# Patient Record
Sex: Female | Born: 1940 | Race: White | Hispanic: No | Marital: Single | Smoking: Current every day smoker
Health system: Southern US, Community
[De-identification: ages and names within clinical notes are randomized; demographics above are authoritative.]

## PROBLEM LIST (undated history)

## (undated) DIAGNOSIS — F329 Major depressive disorder, single episode, unspecified: Secondary | ICD-10-CM

## (undated) DIAGNOSIS — F419 Anxiety disorder, unspecified: Secondary | ICD-10-CM

## (undated) DIAGNOSIS — K219 Gastro-esophageal reflux disease without esophagitis: Secondary | ICD-10-CM

## (undated) DIAGNOSIS — Z9889 Other specified postprocedural states: Secondary | ICD-10-CM

## (undated) DIAGNOSIS — R112 Nausea with vomiting, unspecified: Secondary | ICD-10-CM

## (undated) DIAGNOSIS — F32A Depression, unspecified: Secondary | ICD-10-CM

## (undated) DIAGNOSIS — Z9289 Personal history of other medical treatment: Secondary | ICD-10-CM

## (undated) DIAGNOSIS — I10 Essential (primary) hypertension: Secondary | ICD-10-CM

## (undated) HISTORY — PX: TUBAL LIGATION: SHX77

## (undated) HISTORY — PX: ABDOMINAL HYSTERECTOMY: SHX81

---
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
## 1966-08-04 DIAGNOSIS — Z9289 Personal history of other medical treatment: Secondary | ICD-10-CM

## 1966-08-04 HISTORY — DX: Personal history of other medical treatment: Z92.89

## 2012-06-03 ENCOUNTER — Other Ambulatory Visit: Payer: Self-pay | Admitting: Orthopedic Surgery

## 2012-06-03 DIAGNOSIS — M5410 Radiculopathy, site unspecified: Secondary | ICD-10-CM

## 2012-06-03 DIAGNOSIS — M549 Dorsalgia, unspecified: Secondary | ICD-10-CM

## 2012-06-03 DIAGNOSIS — M4800 Spinal stenosis, site unspecified: Secondary | ICD-10-CM

## 2012-06-09 ENCOUNTER — Other Ambulatory Visit: Payer: Self-pay | Admitting: Neurosurgery

## 2012-06-09 ENCOUNTER — Ambulatory Visit
Admission: RE | Admit: 2012-06-09 | Discharge: 2012-06-09 | Disposition: A | Discharge status: Home / Self Care | Payer: No Typology Code available for payment source | Source: Ambulatory Visit | Attending: Orthopedic Surgery | Admitting: Orthopedic Surgery

## 2012-06-09 ENCOUNTER — Ambulatory Visit
Admission: RE | Admit: 2012-06-09 | Discharge: 2012-06-09 | Disposition: A | Discharge status: Home / Self Care | Payer: Medicare Other | Source: Ambulatory Visit | Attending: Orthopedic Surgery | Admitting: Orthopedic Surgery

## 2012-06-09 VITALS — BP 133/57 | HR 46 | Ht 61.0 in | Wt 120.0 lb

## 2012-06-09 DIAGNOSIS — M4800 Spinal stenosis, site unspecified: Secondary | ICD-10-CM

## 2012-06-09 DIAGNOSIS — M549 Dorsalgia, unspecified: Secondary | ICD-10-CM

## 2012-06-09 DIAGNOSIS — M5410 Radiculopathy, site unspecified: Secondary | ICD-10-CM

## 2012-06-09 MED ORDER — MEPERIDINE HCL 100 MG/ML IJ SOLN
75.0000 mg | Freq: Once | INTRAMUSCULAR | Status: AC
  Administered 2012-06-09: 75 mg via INTRAMUSCULAR

## 2012-06-09 MED ORDER — IOHEXOL 180 MG/ML  SOLN
15.0000 mL | Freq: Once | INTRAMUSCULAR | Status: AC | PRN
  Administered 2012-06-09: 15 mL via INTRATHECAL

## 2012-06-09 MED ORDER — HYDROXYZINE HCL 50 MG/ML IM SOLN
25.0000 mg | Freq: Once | INTRAMUSCULAR | Status: AC
  Administered 2012-06-09: 25 mg via INTRAMUSCULAR

## 2012-06-09 MED ORDER — DIAZEPAM 5 MG PO TABS
5.0000 mg | ORAL_TABLET | Freq: Once | ORAL | Status: AC
Start: 2012-06-09 — End: 2012-06-09
  Administered 2012-06-09: 5 mg via ORAL

## 2012-06-09 NOTE — Progress Notes (Signed)
Patient states she has been off Prozac for at least the past two days.  jkl 

## 2012-08-12 ENCOUNTER — Other Ambulatory Visit: Payer: Self-pay | Admitting: Neurosurgery

## 2012-10-11 ENCOUNTER — Encounter (HOSPITAL_COMMUNITY)
Admission: RE | Admit: 2012-10-11 | Discharge: 2012-10-11 | Disposition: A | Discharge status: Home / Self Care | Payer: Medicare Other | Source: Ambulatory Visit | Attending: Anesthesiology | Admitting: Anesthesiology

## 2012-10-11 ENCOUNTER — Encounter (HOSPITAL_COMMUNITY): Payer: Self-pay

## 2012-10-11 ENCOUNTER — Encounter (HOSPITAL_COMMUNITY)
Admission: RE | Admit: 2012-10-11 | Discharge: 2012-10-11 | Disposition: A | Discharge status: Home / Self Care | Payer: Medicare Other | Source: Ambulatory Visit | Attending: Neurosurgery | Admitting: Neurosurgery

## 2012-10-11 DIAGNOSIS — Z01818 Encounter for other preprocedural examination: Secondary | ICD-10-CM | POA: Insufficient documentation

## 2012-10-11 DIAGNOSIS — M5126 Other intervertebral disc displacement, lumbar region: Secondary | ICD-10-CM | POA: Insufficient documentation

## 2012-10-11 DIAGNOSIS — Z01812 Encounter for preprocedural laboratory examination: Secondary | ICD-10-CM | POA: Insufficient documentation

## 2012-10-11 DIAGNOSIS — Z01810 Encounter for preprocedural cardiovascular examination: Secondary | ICD-10-CM | POA: Insufficient documentation

## 2012-10-11 DIAGNOSIS — I10 Essential (primary) hypertension: Secondary | ICD-10-CM | POA: Insufficient documentation

## 2012-10-11 HISTORY — DX: Other specified postprocedural states: Z98.890

## 2012-10-11 HISTORY — DX: Depression, unspecified: F32.A

## 2012-10-11 HISTORY — DX: Nausea with vomiting, unspecified: R11.2

## 2012-10-11 HISTORY — DX: Major depressive disorder, single episode, unspecified: F32.9

## 2012-10-11 HISTORY — DX: Essential (primary) hypertension: I10

## 2012-10-11 HISTORY — DX: Gastro-esophageal reflux disease without esophagitis: K21.9

## 2012-10-11 LAB — COMPREHENSIVE METABOLIC PANEL
ALT: 27 U/L (ref 0–35)
Calcium: 9.6 mg/dL (ref 8.4–10.5)
Creatinine, Ser: 0.58 mg/dL (ref 0.50–1.10)
GFR calc Af Amer: >90 mL/min (ref >90)
Glucose, Bld: 101 mg/dL — ABNORMAL HIGH (ref 70–99)
Sodium: 139 mEq/L (ref 135–145)
Total Protein: 7.1 g/dL (ref 6.0–8.3)

## 2012-10-11 LAB — CBC
Hemoglobin: 14.3 g/dL (ref 12.0–15.0)
MCH: 32.1 pg (ref 26.0–34.0)
MCHC: 35.6 g/dL (ref 30.0–36.0)

## 2012-10-11 LAB — TYPE AND SCREEN
ABO/RH(D): O POS
Antibody Screen: NEGATIVE

## 2012-10-11 LAB — ABO/RH: ABO/RH(D): O POS

## 2012-10-11 LAB — SURGICAL PCR SCREEN: MRSA, PCR: NEGATIVE

## 2012-10-11 NOTE — Progress Notes (Addendum)
Monday...went to [facility] ER in 2003 for rapid heartrate.  Was given a shot (unsure of what it was).  It helped and patient has had no follow up  nor does she has the same problem.Marland Kitchen   [facility]Marland Kitchen DA   PCP has moved out of [place] and is now in [place].Marland KitchenMarland Kitchen#[phone]........fax [phone].  I have asked for anything cardiac, lov, ekg...Marland KitchenDA

## 2012-10-11 NOTE — Pre-Procedure Instructions (Signed)
Anber Mckiver  2018-09-712   Your procedure is scheduled on:  Wednesday, March 12th   Report to [facility] at 6:30 AM.  Call this number if you have problems the morning of surgery: [phone]   Remember:   Do not eat food or drink liquids after midnight Tuesday.   Take these medicines the morning of surgery with A SIP OF WATER:  None   Do not wear jewelry, make-up or nail polish.  Do not wear lotions, powders, or perfumes. You may NOT wear deodorant.  Do not shave underarms & legs 48 hours prior to surgery.    Do not bring valuables to the hospital.  Contacts, dentures or bridgework may not be worn into surgery.   Leave suitcase in the car. After surgery it may be brought to your room.  For patients admitted to the hospital, checkout time is 11:00 AM the day of discharge.   Name and phone number of your driver:    Special Instructions: Shower using CHG 2 nights before surgery and the night before surgery.  If you shower the day of surgery use CHG.  Use special wash - you have one bottle of CHG for all showers.  You should use approximately 1/3 of the bottle for each shower.   Please read over the following fact sheets that you were given: Pain Booklet, Coughing and Deep Breathing, Blood Transfusion Information, MRSA Information and Surgical Site Infection Prevention

## 2012-10-13 ENCOUNTER — Inpatient Hospital Stay (HOSPITAL_COMMUNITY): Admission: RE | Admit: 2012-10-13 | Payer: Medicare Other | Source: Ambulatory Visit | Admitting: Neurosurgery

## 2012-10-13 ENCOUNTER — Encounter (HOSPITAL_COMMUNITY): Admission: RE | Payer: Self-pay | Source: Ambulatory Visit

## 2012-10-13 SURGERY — POSTERIOR LUMBAR FUSION 3 LEVEL
Anesthesia: General

## 2013-02-10 IMAGING — CT CT L SPINE W/ CM
3 of 10 series · 9 of 27 positions shown, 10 images · IV contrast (omnipaque)
Comparison: MRI lumbar spine 04/21/2012 at [REDACTED].

CLINICAL DATA: Low back pain extending into the right hip and
lower extremity.  Right lower extremity contractures.

MYELOGRAM INJECTION
TECHNIQUE: Informed consent was obtained from the patient prior to
the procedure, including potential complications of headache,
allergy, infection and pain.  A timeout procedure was performed.
With the patient prone, the lower back was prepped with Betadine.
1% Lidocaine was used for local anesthesia.  Lumbar puncture was
performed at the left paramidline L1-2 level using a 22 gauge
needle with return of clear CSF.  15 ml of Omnipaque 947was
injected into the subarachnoid space .
TECHNIQUE: I personally performed the lumbar puncture and
administered the intrathecal contrast. I also personally supervised
acquisition of the myelogram images. Following injection of
intrathecal Omnipaque contrast, spine imaging in multiple
projections was performed using fluoroscopy.
Fluoroscopy Time: 53 seconds.
TECHNIQUE: CT imaging of the lumbar spine was performed after
intrathecal contrast administration.  Multiplanar CT image
reconstructions were also generated.

[Series 2: l spine bone · axial · 0.27mm/px · z∈[-4,+71]mm · 2 of 90 slices shown, 3 images]
[im 30/90  soft-tissue]
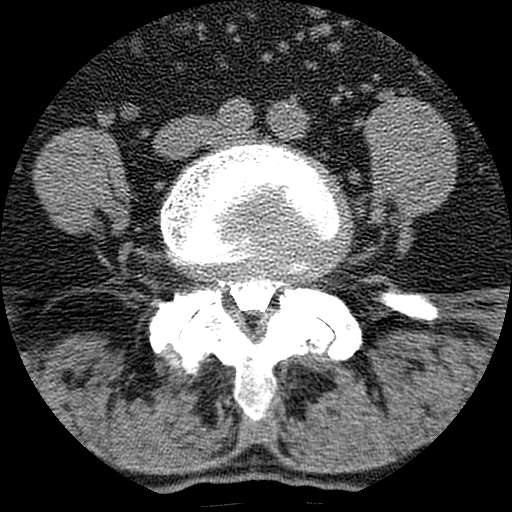
[im 30/90  bone]
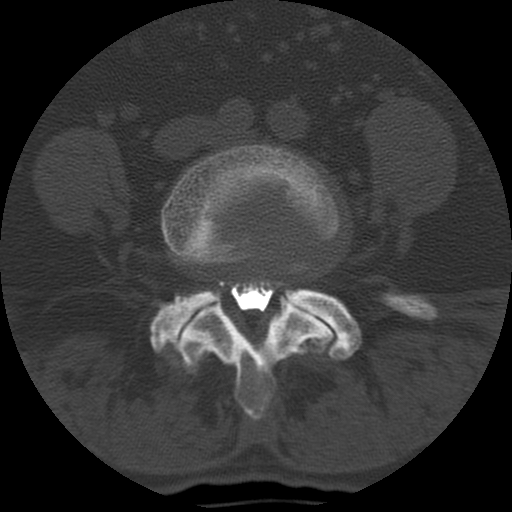
[im 60/90  bone]
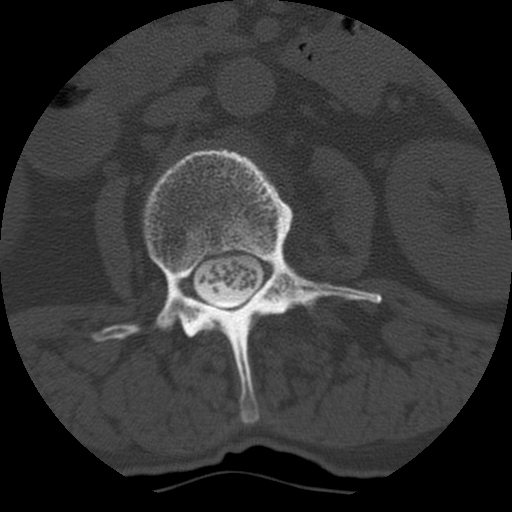

[Series 3: l spine soft · axial · 0.27mm/px · z∈[-4,+71]mm · 2 of 90 slices shown]
[im 30/90  soft-tissue]
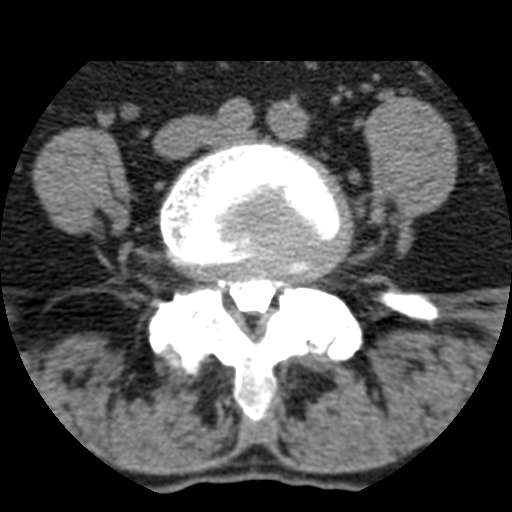
[im 60/90  soft-tissue]
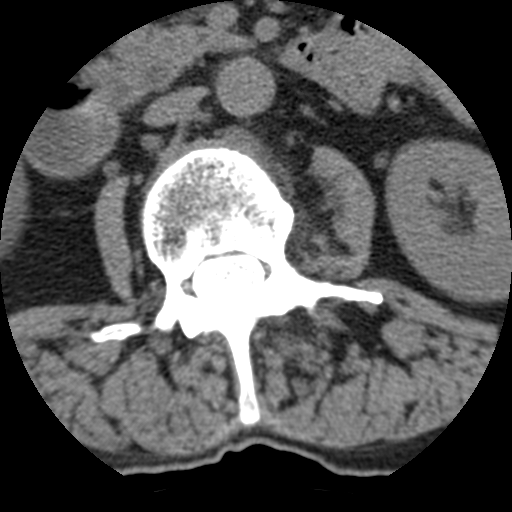

[Series 400: coronal · coronal · 0.44mm/px · 5 of 50 slices shown]
[im 9/50  bone]
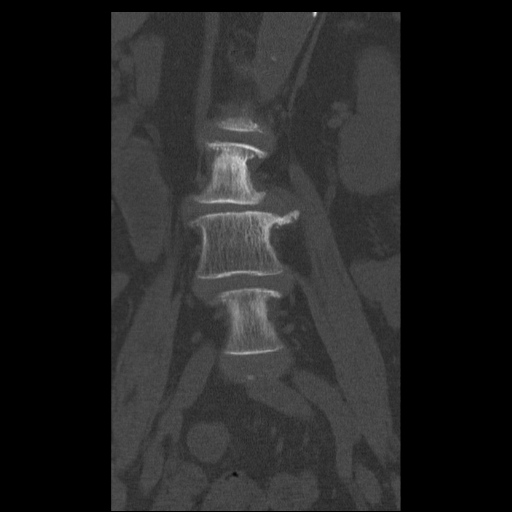
[im 17/50  bone]
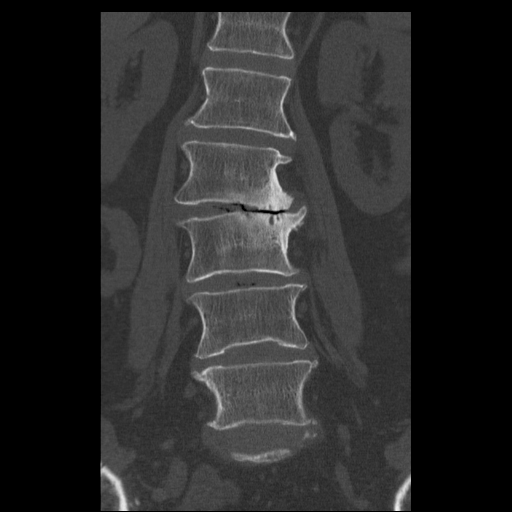
[im 25/50  bone]
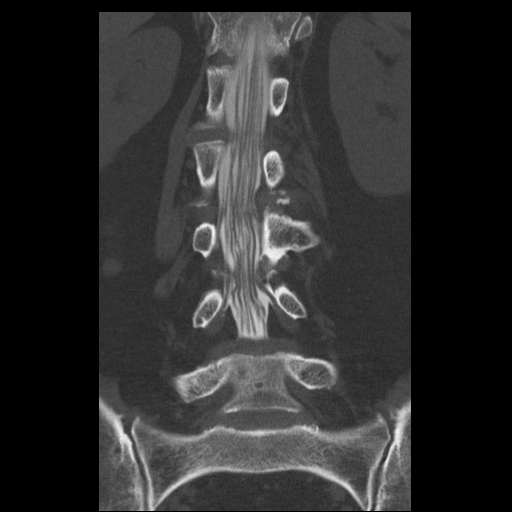
[im 33/50  bone]
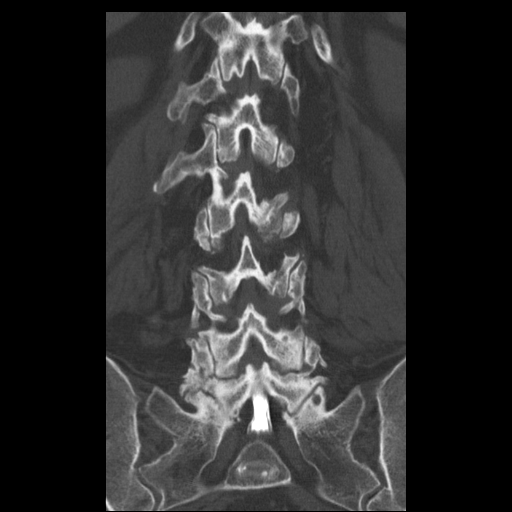
[im 41/50  bone]
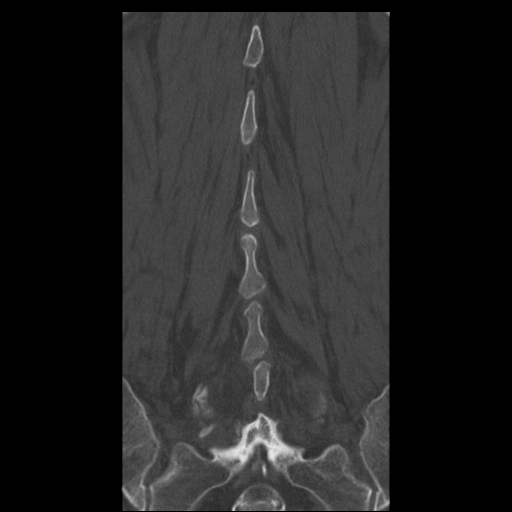

[9 of 27 positions shown; findings below may reference images not displayed]

IMPRESSION: Successful injection of  intrathecal contrast for myelography.

MYELOGRAM LUMBAR
FINDINGS: Five non-rib bearing lumbar type vertebral bodies are
present.  Asymmetric loss of disc height is evident on the left at
L2-3.  Moderate left lateral recess narrowing is present on the
left at L2-3 with medial displacement of the nerve roots.  There is
minimal lateral recess narrowing on the right at L2-3 bilaterally
L1-2.  Grade 1 anterolisthesis is present at L3-4 and L4-5.
Minimal anterolisthesis is present at L5-S1.  Moderate central
canal stenosis is present L3-4 with left greater than right lateral
recess narrowing.  Mild lateral recess narrowing is present
bilaterally at L4-5.  Mild moderate lateral recess narrowing at L5-
S1 is worse on the right.

The upright images demonstrate no significant change in the
anterolisthesis or central canal narrowing.  Alignment is stable
through flexion and extension.
IMPRESSION: 1.  Moderate central canal stenosis at L2-3 and L3-4 with left
greater than right lateral recess narrowing.
2.  Grade 1 anterolisthesis at L3-4, L4-5, and to a lesser extent
at L5-S1.  This does not change significantly with flexion or
extension.
3.  Mild lateral recess narrowing bilaterally at L4-5.
4.  Mild to moderate lateral recess and bilaterally at L5-S1 is
worse on the right.


CT MYELOGRAPHY LUMBAR SPINE
FINDINGS: The lumbar spine is imaged from the midbody of T12-S2.
Conus medullaris terminates at L1, within normal limits.  Rightward
curvature of the lumbar spine is centered at L2-3 with asymmetric
left-sided endplate changes.  Anterolisthesis at L3-4 measures 5
mm.  Anterolisthesis at L4-5 measures 4 mm.  Anterolisthesis at L5-
S1 is 2 mm.

Limited imaging of the abdomen demonstrates mild atherosclerotic
calcifications.  There is no significant aneurysm.

L1-2:  A mild broad-based disc bulge is present.  Facet hypertrophy
is worse on the right.  No significant stenosis is present.

L2-3:  A broad-based disc herniation is asymmetric to the left.
Asymmetric left-sided facet hypertrophy is present.  This leads to
mild left lateral recess and foraminal narrowing.  Minimal right
foraminal stenosis is present as well.

L3-4:  A broad-based disc herniation is present.  Moderate facet
hypertrophy is noted bilaterally.  This leads to moderate central
canal stenosis with left greater than right lateral recess
narrowing.  Mild to moderate left and mild right foraminal stenosis
is present.

L4-5:  A broad-based disc herniation is present.  Moderate facet
hypertrophy is evident.  Mild lateral recess narrowing is present
bilaterally.  Although there is encroachment into the neural
foramina bilaterally, the neural foramina are patent.

L5-S1:  A mild broad-based disc bulge is present.  Advanced facet
hypertrophy is evident bilaterally.  A synovial cyst emanating from
the right facet joint is more prominent on the MRI.  This impacts
the exiting right S1 nerve root.  Asymmetric right-sided facet
spurring is also present.
IMPRESSION: 1.  The most pronounced right-sided disease is at L5-S1 where right-
sided facet spurring and a synovial cyst contribute to lateral
recess stenosis.
2.  Mild foraminal narrowing bilaterally at L5-S1.
3.  Mild lateral recess narrowing bilaterally L4-5.

4.  Moderate central canal stenosis at L3-4 with left greater than
right lateral recess narrowing.
5.  Mild to moderate left mild right foraminal stenosis at L3-4.
6.  Mild left lateral recess and foraminal stenosis at L2-3.
7.  Mild disc bulging at L1-2 without significant stenosis.
8.  Atherosclerosis.
9.  Rightward curvature lumbar spine is centered at L2-3 with
chronic end plate marrow changes on the left at L2-3.

## 2013-07-31 ENCOUNTER — Encounter (HOSPITAL_COMMUNITY): Payer: Self-pay | Admitting: Emergency Medicine

## 2013-07-31 ENCOUNTER — Emergency Department (HOSPITAL_COMMUNITY)
Admission: EM | Admit: 2013-07-31 | Discharge: 2013-08-01 | Disposition: A | Discharge status: Home / Self Care | Payer: Medicare Other | Attending: Emergency Medicine | Admitting: Emergency Medicine

## 2013-07-31 DIAGNOSIS — F17200 Nicotine dependence, unspecified, uncomplicated: Secondary | ICD-10-CM | POA: Insufficient documentation

## 2013-07-31 DIAGNOSIS — R112 Nausea with vomiting, unspecified: Secondary | ICD-10-CM | POA: Insufficient documentation

## 2013-07-31 DIAGNOSIS — R531 Weakness: Secondary | ICD-10-CM

## 2013-07-31 DIAGNOSIS — F329 Major depressive disorder, single episode, unspecified: Secondary | ICD-10-CM | POA: Insufficient documentation

## 2013-07-31 DIAGNOSIS — F3289 Other specified depressive episodes: Secondary | ICD-10-CM | POA: Insufficient documentation

## 2013-07-31 DIAGNOSIS — R197 Diarrhea, unspecified: Secondary | ICD-10-CM | POA: Insufficient documentation

## 2013-07-31 DIAGNOSIS — E876 Hypokalemia: Secondary | ICD-10-CM | POA: Insufficient documentation

## 2013-07-31 DIAGNOSIS — Z8719 Personal history of other diseases of the digestive system: Secondary | ICD-10-CM | POA: Insufficient documentation

## 2013-07-31 DIAGNOSIS — Z79899 Other long term (current) drug therapy: Secondary | ICD-10-CM | POA: Insufficient documentation

## 2013-07-31 DIAGNOSIS — I10 Essential (primary) hypertension: Secondary | ICD-10-CM | POA: Insufficient documentation

## 2013-07-31 LAB — CBC WITH DIFFERENTIAL/PLATELET
Basophils Absolute: 0 10*3/uL (ref 0.0–0.1)
Basophils Relative: 0 % (ref 0–1)
Eosinophils Absolute: 0 10*3/uL (ref 0.0–0.7)
HCT: 45.9 % (ref 36.0–46.0)
Hemoglobin: 15.9 g/dL — ABNORMAL HIGH (ref 12.0–15.0)
MCH: 32.6 pg (ref 26.0–34.0)
MCHC: 34.6 g/dL (ref 30.0–36.0)
Monocytes Relative: 6 % (ref 3–12)
Neutro Abs: 17.7 10*3/uL — ABNORMAL HIGH (ref 1.7–7.7)
Neutrophils Relative %: 90 % — ABNORMAL HIGH (ref 43–77)
Platelets: 226 10*3/uL (ref 150–400)

## 2013-07-31 MED ORDER — ONDANSETRON HCL 4 MG/2ML IJ SOLN
4.0000 mg | Freq: Once | INTRAMUSCULAR | Status: AC
  Administered 2013-07-31: 4 mg via INTRAVENOUS
  Filled 2013-07-31: qty 2

## 2013-07-31 MED ORDER — SODIUM CHLORIDE 0.9 % IV SOLN
1000.0000 mL | INTRAVENOUS | Status: DC
  Administered 2013-07-31: 1000 mL via INTRAVENOUS

## 2013-07-31 MED ORDER — SODIUM CHLORIDE 0.9 % IV SOLN
1000.0000 mL | Freq: Once | INTRAVENOUS | Status: AC
  Administered 2013-07-31: 1000 mL via INTRAVENOUS

## 2013-07-31 NOTE — ED Notes (Signed)
Pt to ED via [facility] for evaluation of N/V/D and weakness.  Pt was in car coming to ED when driver had to pull over due to not feeling well.  Pt reports feeling weak for the past week.  Onset of N/V/D 1 hour ago.  Pt was hypotensive for EMS 70 systolic- IV started, 500 cc fluid given, 4mg  Zofran.  CBG 166.  Alert and oriented X 4 at present.

## 2013-07-31 NOTE — ED Notes (Signed)
Pt reports that 10 minutes before symptoms started her friend gave her "2 purple pills" - pt states she thinks they were Aleve but she is not sure.

## 2013-07-31 NOTE — ED Provider Notes (Signed)
CSN: [id number]     Arrival date & time 07/31/13  2136 History   First MD Initiated Contact with Patient 07/31/13 2255     Chief Complaint  Patient presents with  . Weakness   (Consider location/radiation/quality/duration/timing/severity/associated sxs/prior Treatment) Patient is a 72 y.o. female presenting with weakness. The history is provided by the patient.  Weakness  She is a somewhat vague and elusive historian. Apparently, she has had some generalized weakness for about the last 3 weeks. That had been stable. Tonight, she was driving her to remain in to the hospital when she had to pull over because she suddenly developed intense nausea, vomiting, diarrhea and profound weakness to the point where she could not walk. She broke out in an intense sweat. She denies any chest pain, heaviness, tightness, pressure. There was mild dyspnea. She denies arthralgias or myalgias. She denies fever or chills. She is feeling somewhat better now although she states she is still a little weak. EMS reported that she was hypotensive with blood pressure of 70 systolic. She was given Zofran and IV fluids prior to arrival. She denies any recent travel or recent exposure to people who have traveled.  Past Medical History  Diagnosis Date  . PONV (postoperative nausea and vomiting)   . GERD (gastroesophageal reflux disease)   . Hypertension   . Depression    Past Surgical History  Procedure Laterality Date  . Tubal ligation    . Hernia repair      right inguinal   No family history on file. History  Substance Use Topics  . Smoking status: Current Every Day Smoker -- 0.50 packs/day for 27 years    Types: Cigarettes  . Smokeless tobacco: Not on file  . Alcohol Use: 3.6 oz/week    6 Cans of beer per week   OB History   Grav Para Term Preterm Abortions TAB SAB Ect Mult Living                 Review of Systems  Neurological: Positive for weakness.  All other systems reviewed and are  negative.    Allergies  Codeine and Oxycodone  Home Medications   Current Outpatient Rx  Name  Route  Sig  Dispense  Refill  . FLUoxetine (PROZAC) 40 MG capsule   Oral   Take 40 mg by mouth daily.         Marland Kitchen HYDROmorphone (DILAUDID) 4 MG tablet   Oral   Take 4 mg by mouth daily. Take every day per patient         . ibuprofen (ADVIL,MOTRIN) 200 MG tablet   Oral   Take 800 mg by mouth daily as needed for pain.         . metoprolol tartrate (LOPRESSOR) 25 MG tablet   Oral   Take 25 mg by mouth 2 (two) times daily.          BP 121/55  Pulse 58  Temp(Src) 95.1 F (35.1 C) (Rectal)  Resp 17  Ht 5\' 3"  (1.6 m)  Wt 135 lb (61.236 kg)  BMI 23.92 kg/m2  SpO2 95% Physical Exam  Nursing note and vitals reviewed.  72 year old female, resting comfortably and in no acute distress. Vital signs are significant for bradycardia with heart rate 58. Oxygen saturation is 95%, which is normal. Head is normocephalic and atraumatic. PERRLA, EOMI. Oropharynx is clear. Neck is nontender and supple without adenopathy or JVD. Back is nontender and there is no CVA tenderness.  Lungs are clear without rales, wheezes, or rhonchi. Chest is nontender. Heart has regular rate and rhythm without murmur. Abdomen is soft, flat, nontender without masses or hepatosplenomegaly and peristalsis is normoactive. Extremities have no cyanosis or edema, full range of motion is present. Skin is warm and dry without rash. Neurologic: Mental status is normal, cranial nerves are intact, there are no motor or sensory deficits.  ED Course  Procedures (including critical care time) Labs Review Results for orders placed during the hospital encounter of 07/31/13  COMPREHENSIVE METABOLIC PANEL      Result Value Range   Sodium 140  135 - 145 mEq/L   Potassium 2.9 (*) 3.5 - 5.1 mEq/L   Chloride 102  96 - 112 mEq/L   CO2 20  19 - 32 mEq/L   Glucose, Bld 133 (*) 70 - 99 mg/dL   BUN 18  6 - 23 mg/dL    Creatinine, Ser 1.61  0.50 - 1.10 mg/dL   Calcium 8.5  8.4 - 09.6 mg/dL   Total Protein 6.4  6.0 - 8.3 g/dL   Albumin 3.4 (*) 3.5 - 5.2 g/dL   AST 33  0 - 37 U/L   ALT 25  0 - 35 U/L   Alkaline Phosphatase 75  39 - 117 U/L   Total Bilirubin 0.3  0.3 - 1.2 mg/dL   GFR calc non Af Amer 73 (*) >90 mL/min   GFR calc Af Amer 85 (*) >90 mL/min  CBC WITH DIFFERENTIAL      Result Value Range   WBC 19.6 (*) 4.0 - 10.5 K/uL   RBC 4.87  3.87 - 5.11 MIL/uL   Hemoglobin 15.9 (*) 12.0 - 15.0 g/dL   HCT 04.5  40.9 - 81.1 %   MCV 94.3  78.0 - 100.0 fL   MCH 32.6  26.0 - 34.0 pg   MCHC 34.6  30.0 - 36.0 g/dL   RDW 91.4  78.2 - 95.6 %   Platelets 226  150 - 400 K/uL   Neutrophils Relative % 90 (*) 43 - 77 %   Neutro Abs 17.7 (*) 1.7 - 7.7 K/uL   Lymphocytes Relative 4 (*) 12 - 46 %   Lymphs Abs 0.8  0.7 - 4.0 K/uL   Monocytes Relative 6  3 - 12 %   Monocytes Absolute 1.1 (*) 0.1 - 1.0 K/uL   Eosinophils Relative 0  0 - 5 %   Eosinophils Absolute 0.0  0.0 - 0.7 K/uL   Basophils Relative 0  0 - 1 %   Basophils Absolute 0.0  0.0 - 0.1 K/uL  URINALYSIS, ROUTINE W REFLEX MICROSCOPIC      Result Value Range   Color, Urine YELLOW  YELLOW   APPearance CLOUDY (*) CLEAR   Specific Gravity, Urine 1.022  1.005 - 1.030   pH 5.5  5.0 - 8.0   Glucose, UA NEGATIVE  NEGATIVE mg/dL   Hgb urine dipstick NEGATIVE  NEGATIVE   Bilirubin Urine SMALL (*) NEGATIVE   Ketones, ur NEGATIVE  NEGATIVE mg/dL   Protein, ur NEGATIVE  NEGATIVE mg/dL   Urobilinogen, UA 0.2  0.0 - 1.0 mg/dL   Nitrite NEGATIVE  NEGATIVE   Leukocytes, UA NEGATIVE  NEGATIVE  TROPONIN I      Result Value Range   Troponin I <0.30  <0.30 ng/mL  MAGNESIUM      Result Value Range   Magnesium 1.9  1.5 - 2.5 mg/dL  CG4 I-STAT (LACTIC ACID)  Result Value Range   Lactic Acid, Venous 2.73 (*) 0.5 - 2.2 mmol/L    EKG Interpretation    Date/Time:  Sunday July 31 2013 22:08:37 EST Ventricular Rate:  58 PR Interval:  200 QRS  Duration: 92 QT Interval:  512 QTC Calculation: 503 R Axis:   25 Text Interpretation:  Sinus rhythm Prolonged QT interval When compared with ECG of 2020/07/1213, QT has lengthened Confirmed by Preston Fleeting  MD, Yasmina Chico ([id number]) on 07/31/2013 11:08:27 PM            MDM   1. Nausea vomiting and diarrhea   2. Weakness   3. Hypokalemia    Generalized weakness of uncertain cause. Nausea, vomiting, diarrhea of uncertain cause. Old records were reviewed and there no relevant past visits. It does sound as if some of her initial presentation was vasovagal which would account for bradycardia, hypotension, nausea, and diaphoresis.  Lactic acid has come back mildly elevated and should improve with IV hydration she was given in the ED. Potassium is come back low at 2.9 and she's given intravenous and oral potassium.  She is feeling much better at this point. She is discharged with prescription for K-Dur.  Dione Booze, MD 08/01/13 [phone]

## 2013-08-01 LAB — URINALYSIS, ROUTINE W REFLEX MICROSCOPIC
Hgb urine dipstick: NEGATIVE
Ketones, ur: NEGATIVE mg/dL
Leukocytes, UA: NEGATIVE
Nitrite: NEGATIVE
Protein, ur: NEGATIVE mg/dL
Specific Gravity, Urine: 1.022 (ref 1.005–1.030)
Urobilinogen, UA: 0.2 mg/dL (ref 0.0–1.0)

## 2013-08-01 LAB — COMPREHENSIVE METABOLIC PANEL
AST: 33 U/L (ref 0–37)
Albumin: 3.4 g/dL — ABNORMAL LOW (ref 3.5–5.2)
Calcium: 8.5 mg/dL (ref 8.4–10.5)
Chloride: 102 mEq/L (ref 96–112)
Creatinine, Ser: 0.8 mg/dL (ref 0.50–1.10)
Total Protein: 6.4 g/dL (ref 6.0–8.3)

## 2013-08-01 LAB — MAGNESIUM: Magnesium: 1.9 mg/dL (ref 1.5–2.5)

## 2013-08-01 LAB — TROPONIN I: Troponin I: <0.3 ng/mL (ref <0.30)

## 2013-08-01 MED ORDER — POTASSIUM CHLORIDE CRYS ER 20 MEQ PO TBCR: 20.0000 meq | EXTENDED_RELEASE_TABLET | Freq: Two times a day (BID) | ORAL | Status: DC

## 2013-08-01 MED ORDER — METOCLOPRAMIDE HCL 10 MG PO TABS: 10.0000 mg | ORAL_TABLET | Freq: Four times a day (QID) | ORAL | Status: AC

## 2013-08-01 MED ORDER — POTASSIUM CHLORIDE 10 MEQ/100ML IV SOLN
10.0000 meq | Freq: Once | INTRAVENOUS | Status: AC
  Administered 2013-08-01: 10 meq via INTRAVENOUS
  Filled 2013-08-01: qty 100

## 2013-08-01 MED ORDER — POTASSIUM CHLORIDE CRYS ER 20 MEQ PO TBCR
40.0000 meq | EXTENDED_RELEASE_TABLET | Freq: Once | ORAL | Status: AC
  Administered 2013-08-01: 40 meq via ORAL
  Filled 2013-08-01: qty 2

## 2013-08-01 NOTE — ED Notes (Signed)
Preparing to be d/c'd, up to b/r, steady gait, denies pain or other sx, friend also being d/c'd at this time. Alert, NAD, calm, interactive. Given Rx x2 and work note.

## 2013-08-01 NOTE — ED Notes (Signed)
Spoke with lab about delay in Troponin results. 

## 2013-08-01 NOTE — ED Notes (Signed)
Dr Preston Fleeting given a copy of lactic acid results 2.73

## 2014-08-07 DIAGNOSIS — E785 Hyperlipidemia, unspecified: Secondary | ICD-10-CM | POA: Diagnosis not present

## 2014-08-07 DIAGNOSIS — J00 Acute nasopharyngitis [common cold]: Secondary | ICD-10-CM | POA: Diagnosis not present

## 2014-08-17 DIAGNOSIS — M545 Low back pain: Secondary | ICD-10-CM | POA: Diagnosis not present

## 2014-08-17 DIAGNOSIS — M79604 Pain in right leg: Secondary | ICD-10-CM | POA: Diagnosis not present

## 2014-08-17 DIAGNOSIS — G8929 Other chronic pain: Secondary | ICD-10-CM | POA: Diagnosis not present

## 2014-08-17 DIAGNOSIS — M79651 Pain in right thigh: Secondary | ICD-10-CM | POA: Diagnosis not present

## 2014-09-14 DIAGNOSIS — M545 Low back pain: Secondary | ICD-10-CM | POA: Diagnosis not present

## 2014-09-14 DIAGNOSIS — M79604 Pain in right leg: Secondary | ICD-10-CM | POA: Diagnosis not present

## 2014-09-14 DIAGNOSIS — M79651 Pain in right thigh: Secondary | ICD-10-CM | POA: Diagnosis not present

## 2014-09-29 DIAGNOSIS — J0100 Acute maxillary sinusitis, unspecified: Secondary | ICD-10-CM | POA: Diagnosis not present

## 2014-09-29 DIAGNOSIS — H66002 Acute suppurative otitis media without spontaneous rupture of ear drum, left ear: Secondary | ICD-10-CM | POA: Diagnosis not present

## 2014-10-10 DIAGNOSIS — R1011 Right upper quadrant pain: Secondary | ICD-10-CM | POA: Diagnosis not present

## 2014-10-12 DIAGNOSIS — G8929 Other chronic pain: Secondary | ICD-10-CM | POA: Diagnosis not present

## 2014-10-12 DIAGNOSIS — M545 Low back pain: Secondary | ICD-10-CM | POA: Diagnosis not present

## 2014-10-12 DIAGNOSIS — M25512 Pain in left shoulder: Secondary | ICD-10-CM | POA: Diagnosis not present

## 2014-10-13 DIAGNOSIS — J209 Acute bronchitis, unspecified: Secondary | ICD-10-CM | POA: Diagnosis not present

## 2014-10-13 DIAGNOSIS — R1011 Right upper quadrant pain: Secondary | ICD-10-CM | POA: Diagnosis not present

## 2014-10-18 DIAGNOSIS — R1011 Right upper quadrant pain: Secondary | ICD-10-CM | POA: Diagnosis not present

## 2014-11-01 DIAGNOSIS — R1011 Right upper quadrant pain: Secondary | ICD-10-CM | POA: Diagnosis not present

## 2014-11-01 DIAGNOSIS — R945 Abnormal results of liver function studies: Secondary | ICD-10-CM | POA: Diagnosis not present

## 2014-11-01 DIAGNOSIS — K760 Fatty (change of) liver, not elsewhere classified: Secondary | ICD-10-CM | POA: Diagnosis not present

## 2014-11-23 DIAGNOSIS — G8929 Other chronic pain: Secondary | ICD-10-CM | POA: Diagnosis not present

## 2014-11-23 DIAGNOSIS — M79651 Pain in right thigh: Secondary | ICD-10-CM | POA: Diagnosis not present

## 2014-11-23 DIAGNOSIS — M545 Low back pain: Secondary | ICD-10-CM | POA: Diagnosis not present

## 2014-11-23 DIAGNOSIS — K5909 Other constipation: Secondary | ICD-10-CM | POA: Diagnosis not present

## 2014-12-22 DIAGNOSIS — G8929 Other chronic pain: Secondary | ICD-10-CM | POA: Diagnosis not present

## 2014-12-22 DIAGNOSIS — M545 Low back pain: Secondary | ICD-10-CM | POA: Diagnosis not present

## 2014-12-22 DIAGNOSIS — G541 Lumbosacral plexus disorders: Secondary | ICD-10-CM | POA: Diagnosis not present

## 2014-12-22 DIAGNOSIS — K5909 Other constipation: Secondary | ICD-10-CM | POA: Diagnosis not present

## 2014-12-22 DIAGNOSIS — G603 Idiopathic progressive neuropathy: Secondary | ICD-10-CM | POA: Diagnosis not present

## 2015-01-18 DIAGNOSIS — G8929 Other chronic pain: Secondary | ICD-10-CM | POA: Diagnosis not present

## 2015-01-18 DIAGNOSIS — M79651 Pain in right thigh: Secondary | ICD-10-CM | POA: Diagnosis not present

## 2015-01-18 DIAGNOSIS — M79604 Pain in right leg: Secondary | ICD-10-CM | POA: Diagnosis not present

## 2015-01-18 DIAGNOSIS — M545 Low back pain: Secondary | ICD-10-CM | POA: Diagnosis not present

## 2016-04-14 DIAGNOSIS — G894 Chronic pain syndrome: Secondary | ICD-10-CM | POA: Diagnosis not present

## 2016-04-14 DIAGNOSIS — I10 Essential (primary) hypertension: Secondary | ICD-10-CM | POA: Diagnosis not present

## 2016-04-22 DIAGNOSIS — N3000 Acute cystitis without hematuria: Secondary | ICD-10-CM | POA: Diagnosis not present

## 2016-04-22 DIAGNOSIS — N3090 Cystitis, unspecified without hematuria: Secondary | ICD-10-CM | POA: Diagnosis not present

## 2016-04-25 DIAGNOSIS — K5732 Diverticulitis of large intestine without perforation or abscess without bleeding: Secondary | ICD-10-CM | POA: Diagnosis not present

## 2016-04-25 DIAGNOSIS — M4806 Spinal stenosis, lumbar region: Secondary | ICD-10-CM | POA: Diagnosis not present

## 2016-04-25 DIAGNOSIS — I700 Atherosclerosis of aorta: Secondary | ICD-10-CM | POA: Diagnosis not present

## 2016-04-25 DIAGNOSIS — R1010 Upper abdominal pain, unspecified: Secondary | ICD-10-CM | POA: Diagnosis not present

## 2016-04-25 DIAGNOSIS — R1030 Lower abdominal pain, unspecified: Secondary | ICD-10-CM | POA: Diagnosis not present

## 2016-05-13 DIAGNOSIS — K649 Unspecified hemorrhoids: Secondary | ICD-10-CM | POA: Diagnosis not present

## 2016-06-02 DIAGNOSIS — N952 Postmenopausal atrophic vaginitis: Secondary | ICD-10-CM | POA: Diagnosis not present

## 2016-06-02 DIAGNOSIS — N8111 Cystocele, midline: Secondary | ICD-10-CM | POA: Diagnosis not present

## 2016-06-02 DIAGNOSIS — N819 Female genital prolapse, unspecified: Secondary | ICD-10-CM | POA: Diagnosis not present

## 2016-06-09 DIAGNOSIS — L820 Inflamed seborrheic keratosis: Secondary | ICD-10-CM | POA: Diagnosis not present

## 2016-06-09 DIAGNOSIS — C44319 Basal cell carcinoma of skin of other parts of face: Secondary | ICD-10-CM | POA: Diagnosis not present

## 2016-07-21 DIAGNOSIS — C44119 Basal cell carcinoma of skin of left eyelid, including canthus: Secondary | ICD-10-CM | POA: Diagnosis not present

## 2016-07-21 DIAGNOSIS — L820 Inflamed seborrheic keratosis: Secondary | ICD-10-CM | POA: Diagnosis not present

## 2016-07-22 DIAGNOSIS — K219 Gastro-esophageal reflux disease without esophagitis: Secondary | ICD-10-CM | POA: Diagnosis not present

## 2016-08-01 DIAGNOSIS — N8111 Cystocele, midline: Secondary | ICD-10-CM | POA: Diagnosis not present

## 2016-08-01 DIAGNOSIS — Z01818 Encounter for other preprocedural examination: Secondary | ICD-10-CM | POA: Diagnosis not present

## 2016-08-01 DIAGNOSIS — R9431 Abnormal electrocardiogram [ECG] [EKG]: Secondary | ICD-10-CM | POA: Diagnosis not present

## 2016-08-01 DIAGNOSIS — Z01810 Encounter for preprocedural cardiovascular examination: Secondary | ICD-10-CM | POA: Diagnosis not present

## 2016-08-01 DIAGNOSIS — R001 Bradycardia, unspecified: Secondary | ICD-10-CM | POA: Diagnosis not present

## 2016-08-01 DIAGNOSIS — N819 Female genital prolapse, unspecified: Secondary | ICD-10-CM | POA: Diagnosis not present

## 2016-08-14 DIAGNOSIS — N819 Female genital prolapse, unspecified: Secondary | ICD-10-CM | POA: Diagnosis not present

## 2016-08-14 DIAGNOSIS — N72 Inflammatory disease of cervix uteri: Secondary | ICD-10-CM | POA: Diagnosis not present

## 2016-08-14 DIAGNOSIS — N8111 Cystocele, midline: Secondary | ICD-10-CM | POA: Diagnosis not present

## 2016-08-14 DIAGNOSIS — Z79899 Other long term (current) drug therapy: Secondary | ICD-10-CM | POA: Diagnosis not present

## 2016-08-14 DIAGNOSIS — I10 Essential (primary) hypertension: Secondary | ICD-10-CM | POA: Diagnosis not present

## 2016-08-14 DIAGNOSIS — K219 Gastro-esophageal reflux disease without esophagitis: Secondary | ICD-10-CM | POA: Diagnosis not present

## 2016-08-14 DIAGNOSIS — N879 Dysplasia of cervix uteri, unspecified: Secondary | ICD-10-CM | POA: Diagnosis not present

## 2016-08-18 DIAGNOSIS — R338 Other retention of urine: Secondary | ICD-10-CM | POA: Diagnosis not present

## 2016-09-18 DIAGNOSIS — J329 Chronic sinusitis, unspecified: Secondary | ICD-10-CM | POA: Diagnosis not present

## 2017-01-05 DIAGNOSIS — M48061 Spinal stenosis, lumbar region without neurogenic claudication: Secondary | ICD-10-CM | POA: Diagnosis not present

## 2017-01-05 DIAGNOSIS — M5416 Radiculopathy, lumbar region: Secondary | ICD-10-CM | POA: Diagnosis not present

## 2017-02-28 DIAGNOSIS — M545 Low back pain: Secondary | ICD-10-CM | POA: Diagnosis not present

## 2017-03-06 DIAGNOSIS — Z789 Other specified health status: Secondary | ICD-10-CM | POA: Diagnosis not present

## 2017-03-06 DIAGNOSIS — H9190 Unspecified hearing loss, unspecified ear: Secondary | ICD-10-CM | POA: Diagnosis not present

## 2017-03-06 DIAGNOSIS — H9319 Tinnitus, unspecified ear: Secondary | ICD-10-CM | POA: Diagnosis not present

## 2017-03-06 DIAGNOSIS — R490 Dysphonia: Secondary | ICD-10-CM | POA: Diagnosis not present

## 2017-03-13 DIAGNOSIS — M5136 Other intervertebral disc degeneration, lumbar region: Secondary | ICD-10-CM | POA: Diagnosis not present

## 2017-03-13 DIAGNOSIS — M545 Low back pain: Secondary | ICD-10-CM | POA: Diagnosis not present

## 2017-03-13 DIAGNOSIS — R1032 Left lower quadrant pain: Secondary | ICD-10-CM | POA: Diagnosis not present

## 2017-03-13 DIAGNOSIS — M4316 Spondylolisthesis, lumbar region: Secondary | ICD-10-CM | POA: Diagnosis not present

## 2017-03-20 DIAGNOSIS — M545 Low back pain: Secondary | ICD-10-CM | POA: Diagnosis not present

## 2017-03-20 DIAGNOSIS — M4316 Spondylolisthesis, lumbar region: Secondary | ICD-10-CM | POA: Diagnosis not present

## 2017-03-23 DIAGNOSIS — R490 Dysphonia: Secondary | ICD-10-CM | POA: Diagnosis not present

## 2017-03-23 DIAGNOSIS — Z789 Other specified health status: Secondary | ICD-10-CM | POA: Diagnosis not present

## 2017-03-23 DIAGNOSIS — K219 Gastro-esophageal reflux disease without esophagitis: Secondary | ICD-10-CM | POA: Diagnosis not present

## 2017-03-23 DIAGNOSIS — J382 Nodules of vocal cords: Secondary | ICD-10-CM | POA: Diagnosis not present

## 2017-03-27 DIAGNOSIS — M48062 Spinal stenosis, lumbar region with neurogenic claudication: Secondary | ICD-10-CM | POA: Diagnosis not present

## 2017-03-27 DIAGNOSIS — M4155 Other secondary scoliosis, thoracolumbar region: Secondary | ICD-10-CM | POA: Diagnosis not present

## 2017-03-27 DIAGNOSIS — M4316 Spondylolisthesis, lumbar region: Secondary | ICD-10-CM | POA: Diagnosis not present

## 2017-03-27 DIAGNOSIS — I10 Essential (primary) hypertension: Secondary | ICD-10-CM | POA: Diagnosis not present

## 2017-04-10 ENCOUNTER — Other Ambulatory Visit: Payer: Self-pay | Admitting: Neurosurgery

## 2017-04-17 NOTE — Pre-Procedure Instructions (Signed)
Ana Alvarado  04/17/2017      [facility] [id number], [place] [address] Phone: [phone] Fax: [phone]    Your procedure is scheduled on September 20  Report to [place] at [place].M.  Call this number if you have problems the morning of surgery:  [phone]   Remember:  Do not eat food or drink liquids after midnight.  Continue all other medications as directed by your physician except follow these medication instructions before surgery   Take these medicines the morning of surgery with A SIP OF WATER  FLUoxetine (PROZAC) HYDROmorphone (DILAUDID) metoCLOPramide (REGLAN metoprolol tartrate (LOPRESSOR) potassium chloride SA (K-DUR,KLOR-CON)   Do not wear jewelry, make-up or nail polish.  Do not wear lotions, powders, or perfumes, or deoderant.  Do not shave 48 hours prior to surgery.    Do not bring valuables to the hospital.  [facility] is not responsible for any belongings or valuables.  Contacts, dentures or bridgework may not be worn into surgery.  Leave your suitcase in the car.  After surgery it may be brought to your room.  For patients admitted to the hospital, discharge time will be determined by your treatment team.  Patients discharged the day of surgery will not be allowed to drive home.    Special instructions:   [facility]- Preparing For Surgery  Before surgery, you can play an important role. Because skin is not sterile, your skin needs to be as free of germs as possible. You can reduce the number of germs on your skin by washing with CHG (chlorahexidine gluconate) Soap before surgery.  CHG is an antiseptic cleaner which kills germs and bonds with the skin to continue killing germs even after washing.  Please do not use if you have an allergy to CHG or antibacterial soaps. If your skin becomes reddened/irritated stop using the CHG.  Do not shave (including  legs and underarms) for at least 48 hours prior to first CHG shower. It is OK to shave your face.  Please follow these instructions carefully.   1. Shower the NIGHT BEFORE SURGERY and the MORNING OF SURGERY with CHG.   2. If you chose to wash your hair, wash your hair first as usual with your normal shampoo.  3. After you shampoo, rinse your hair and body thoroughly to remove the shampoo.  4. Use CHG as you would any other liquid soap. You can apply CHG directly to the skin and wash gently with a scrungie or a clean washcloth.   5. Apply the CHG Soap to your body ONLY FROM THE NECK DOWN.  Do not use on open wounds or open sores. Avoid contact with your eyes, ears, mouth and genitals (private parts). Wash genitals (private parts) with your normal soap.  6. Wash thoroughly, paying special attention to the area where your surgery will be performed.  7. Thoroughly rinse your body with warm water from the neck down.  8. DO NOT shower/wash with your normal soap after using and rinsing off the CHG Soap.  9. Pat yourself dry with a CLEAN TOWEL.   10. Wear CLEAN PAJAMAS   11. Place CLEAN SHEETS on your bed the night of your first shower and DO NOT SLEEP WITH PETS.    Day of Surgery: Do not apply any deodorants/lotions. Please wear clean clothes to the hospital/surgery center.      Please read over  the following fact sheets that you were given.

## 2017-04-20 ENCOUNTER — Encounter (HOSPITAL_COMMUNITY): Payer: Self-pay

## 2017-04-20 ENCOUNTER — Encounter (HOSPITAL_COMMUNITY)
Admission: RE | Admit: 2017-04-20 | Discharge: 2017-04-20 | Disposition: A | Discharge status: Home / Self Care | Payer: Medicare Other | Source: Ambulatory Visit | Attending: Neurosurgery | Admitting: Neurosurgery

## 2017-04-20 DIAGNOSIS — Z01812 Encounter for preprocedural laboratory examination: Secondary | ICD-10-CM

## 2017-04-20 DIAGNOSIS — T402X5A Adverse effect of other opioids, initial encounter: Secondary | ICD-10-CM | POA: Diagnosis not present

## 2017-04-20 DIAGNOSIS — R410 Disorientation, unspecified: Secondary | ICD-10-CM | POA: Diagnosis not present

## 2017-04-20 DIAGNOSIS — M48062 Spinal stenosis, lumbar region with neurogenic claudication: Secondary | ICD-10-CM | POA: Diagnosis not present

## 2017-04-20 DIAGNOSIS — Z79899 Other long term (current) drug therapy: Secondary | ICD-10-CM | POA: Diagnosis not present

## 2017-04-20 DIAGNOSIS — M4185 Other forms of scoliosis, thoracolumbar region: Secondary | ICD-10-CM | POA: Diagnosis not present

## 2017-04-20 DIAGNOSIS — M5116 Intervertebral disc disorders with radiculopathy, lumbar region: Secondary | ICD-10-CM | POA: Diagnosis not present

## 2017-04-20 DIAGNOSIS — G8929 Other chronic pain: Secondary | ICD-10-CM | POA: Diagnosis not present

## 2017-04-20 DIAGNOSIS — Z01810 Encounter for preprocedural cardiovascular examination: Secondary | ICD-10-CM | POA: Insufficient documentation

## 2017-04-20 DIAGNOSIS — Z885 Allergy status to narcotic agent status: Secondary | ICD-10-CM | POA: Diagnosis not present

## 2017-04-20 DIAGNOSIS — I10 Essential (primary) hypertension: Secondary | ICD-10-CM | POA: Diagnosis not present

## 2017-04-20 DIAGNOSIS — Z886 Allergy status to analgesic agent status: Secondary | ICD-10-CM | POA: Diagnosis not present

## 2017-04-20 DIAGNOSIS — M4690 Unspecified inflammatory spondylopathy, site unspecified: Secondary | ICD-10-CM | POA: Diagnosis not present

## 2017-04-20 DIAGNOSIS — M4316 Spondylolisthesis, lumbar region: Secondary | ICD-10-CM | POA: Diagnosis not present

## 2017-04-20 DIAGNOSIS — K219 Gastro-esophageal reflux disease without esophagitis: Secondary | ICD-10-CM | POA: Diagnosis not present

## 2017-04-20 DIAGNOSIS — M4180 Other forms of scoliosis, site unspecified: Secondary | ICD-10-CM | POA: Diagnosis not present

## 2017-04-20 HISTORY — DX: Anxiety disorder, unspecified: F41.9

## 2017-04-20 HISTORY — DX: Personal history of other medical treatment: Z92.89

## 2017-04-20 LAB — TYPE AND SCREEN
ABO/RH(D): O POS
Antibody Screen: NEGATIVE

## 2017-04-20 LAB — CBC
HEMATOCRIT: 44.6 % (ref 36.0–46.0)
HEMOGLOBIN: 15 g/dL (ref 12.0–15.0)
MCH: 31.9 pg (ref 26.0–34.0)
MCHC: 33.6 g/dL (ref 30.0–36.0)
MCV: 94.9 fL (ref 78.0–100.0)
Platelets: 277 10*3/uL (ref 150–400)
RBC: 4.7 MIL/uL (ref 3.87–5.11)
RDW: 12.7 % (ref 11.5–15.5)
WBC: 8.3 10*3/uL (ref 4.0–10.5)

## 2017-04-20 LAB — BASIC METABOLIC PANEL
ANION GAP: 5 (ref 5–15)
BUN: 10 mg/dL (ref 6–20)
CALCIUM: 9.3 mg/dL (ref 8.9–10.3)
CHLORIDE: 105 mmol/L (ref 101–111)
CO2: 28 mmol/L (ref 22–32)
Creatinine, Ser: 0.69 mg/dL (ref 0.44–1.00)
GFR calc non Af Amer: >60 mL/min (ref >60)
GLUCOSE: 104 mg/dL — AB (ref 65–99)
Potassium: 3.6 mmol/L (ref 3.5–5.1)
Sodium: 138 mmol/L (ref 135–145)

## 2017-04-20 LAB — SURGICAL PCR SCREEN
MRSA, PCR: NEGATIVE
STAPHYLOCOCCUS AUREUS: NEGATIVE

## 2017-04-20 NOTE — Pre-Procedure Instructions (Signed)
Ana Alvarado  04/20/2017      Your procedure is scheduled on September 20  Report to [name] at [place].M.                  Your surgery or procedure is scheduled for 7:30 AM    Remember:  Do not eat food or drink liquids after midnight. [id number]  Continue all other medications as directed by your physician except follow these medication instructions before surgery   Take these medicines the morning of surgery with A SIP OF WATER:  metoprolol tartrate (LOPRESSOR) Gabapentin                  Take if needed: Zantac     Special instructions:   [facility]- Preparing For Surgery  Before surgery, you can play an important role. Because skin is not sterile, your skin needs to be as free of germs as possible. You can reduce the number of germs on your skin by washing with CHG (chlorahexidine gluconate) Soap before surgery.  CHG is an antiseptic cleaner which kills germs and bonds with the skin to continue killing germs even after washing.  Please do not use if you have an allergy to CHG or antibacterial soaps. If your skin becomes reddened/irritated stop using the CHG.  Do not shave (including legs and underarms) for at least 48 hours prior to first CHG shower. It is OK to shave your face.  Please follow these instructions carefully.   1. Shower the NIGHT BEFORE SURGERY and the MORNING OF SURGERY with CHG.   2. If you chose to wash your hair, wash your hair first as usual with your normal shampoo.  3. After you shampoo, rinse your hair and body thoroughly to remove the shampoo.  Wash your private area with your regular home soap  4. Use CHG as you would any other liquid soap. You can apply CHG directly to the skin and wash gently with a scrungie or a clean washcloth.   5. Apply the CHG Soap to your body ONLY FROM THE NECK DOWN.  Do not use on open wounds or open sores. Avoid contact with your eyes, ears, mouth and genitals (private parts). Wash  genitals (private parts) with your normal soap.  6. Wash thoroughly, paying special attention to the area where your surgery will be performed.  7. Thoroughly rinse your body with warm water from the neck down.  8. DO NOT shower/wash with your normal soap after using and rinsing off the CHG Soap.  9. Pat yourself dry with a CLEAN TOWEL.   10. Wear CLEAN PAJAMAS   11. Place CLEAN SHEETS on your bed the night of your first shower and DO NOT SLEEP WITH PETS.  Day of Surgery: Shower as above Do not apply any deodorants/lotions. Please wear clean clothes to the hospital/surgery center.   Do NOT Shave within 48 hours prior to surgery. Do NOT wear jewelry, piercing's, make- up, nail polish DO not bring any valuables the hospital is not responsible for them. Contacts, dentures or bridgework may not be worn into surgery.  Leave your suitcase in the car.  After surgery it may be brought to your room.  For patients admitted to  the hospital, discharge time will be determined by your treatment team.  Patients discharged the day of surgery will not be allowed to drive home.   Please read over the following fact sheets that you were given.

## 2017-04-22 ENCOUNTER — Encounter (HOSPITAL_COMMUNITY): Payer: Self-pay | Admitting: Anesthesiology

## 2017-04-22 NOTE — Anesthesia Preprocedure Evaluation (Addendum)
Anesthesia Evaluation  Patient identified by MRN, date of birth, ID band Patient awake    Reviewed: Allergy & Precautions, NPO status , Patient's Chart, lab work & pertinent test results  History of Anesthesia Complications (+) PONV  Airway Mallampati: I       Dental  (+) Edentulous Upper, Edentulous Lower   Pulmonary Current Smoker,    Pulmonary exam normal breath sounds clear to auscultation       Cardiovascular hypertension, Pt. on home beta blockers  Rhythm:Regular Rate:Normal     Neuro/Psych negative neurological ROS     GI/Hepatic Neg liver ROS, GERD  Medicated,  Endo/Other  negative endocrine ROS  Renal/GU negative Renal ROS  negative genitourinary   Musculoskeletal negative musculoskeletal ROS (+)   Abdominal Normal abdominal exam  (+)   Peds  Hematology negative hematology ROS (+)   Anesthesia Other Findings   Reproductive/Obstetrics                            Anesthesia Physical Anesthesia Plan  ASA: II  Anesthesia Plan: General   Post-op Pain Management:    Induction: Intravenous  PONV Risk Score and Plan: 4 or greater and Ondansetron, Dexamethasone and Midazolam  Airway Management Planned: Oral ETT  Additional Equipment:   Intra-op Plan:   Post-operative Plan: Extubation in OR  Informed Consent: I have reviewed the patients History and Physical, chart, labs and discussed the procedure including the risks, benefits and alternatives for the proposed anesthesia with the patient or authorized representative who has indicated his/her understanding and acceptance.   Dental advisory given  Plan Discussed with: CRNA and Surgeon  Anesthesia Plan Comments:        Anesthesia Quick Evaluation

## 2017-04-23 ENCOUNTER — Inpatient Hospital Stay (HOSPITAL_COMMUNITY): Payer: Medicare Other

## 2017-04-23 ENCOUNTER — Inpatient Hospital Stay (HOSPITAL_COMMUNITY): Payer: Medicare Other | Admitting: Certified Registered Nurse Anesthetist

## 2017-04-23 ENCOUNTER — Inpatient Hospital Stay (HOSPITAL_COMMUNITY)
Admission: RE | Admit: 2017-04-23 | Discharge: 2017-04-25 | DRG: 455 | Disposition: A | Discharge status: Skilled Nursing Facility | Payer: Medicare Other | Source: Ambulatory Visit | Attending: Neurosurgery | Admitting: Neurosurgery

## 2017-04-23 ENCOUNTER — Encounter (HOSPITAL_COMMUNITY): Payer: Self-pay | Admitting: *Deleted

## 2017-04-23 ENCOUNTER — Encounter (HOSPITAL_COMMUNITY)
Admission: RE | Disposition: A | Discharge status: Skilled Nursing Facility | Payer: Self-pay | Source: Ambulatory Visit | Attending: Neurosurgery

## 2017-04-23 DIAGNOSIS — M5116 Intervertebral disc disorders with radiculopathy, lumbar region: Secondary | ICD-10-CM | POA: Diagnosis present

## 2017-04-23 DIAGNOSIS — J9811 Atelectasis: Secondary | ICD-10-CM | POA: Diagnosis not present

## 2017-04-23 DIAGNOSIS — R410 Disorientation, unspecified: Secondary | ICD-10-CM | POA: Diagnosis not present

## 2017-04-23 DIAGNOSIS — M4185 Other forms of scoliosis, thoracolumbar region: Secondary | ICD-10-CM | POA: Diagnosis not present

## 2017-04-23 DIAGNOSIS — M4690 Unspecified inflammatory spondylopathy, site unspecified: Secondary | ICD-10-CM | POA: Diagnosis not present

## 2017-04-23 DIAGNOSIS — M4316 Spondylolisthesis, lumbar region: Secondary | ICD-10-CM | POA: Diagnosis present

## 2017-04-23 DIAGNOSIS — T402X5A Adverse effect of other opioids, initial encounter: Secondary | ICD-10-CM | POA: Diagnosis not present

## 2017-04-23 DIAGNOSIS — R509 Fever, unspecified: Secondary | ICD-10-CM

## 2017-04-23 DIAGNOSIS — Z886 Allergy status to analgesic agent status: Secondary | ICD-10-CM | POA: Diagnosis not present

## 2017-04-23 DIAGNOSIS — E876 Hypokalemia: Secondary | ICD-10-CM | POA: Diagnosis not present

## 2017-04-23 DIAGNOSIS — M4320 Fusion of spine, site unspecified: Secondary | ICD-10-CM | POA: Diagnosis not present

## 2017-04-23 DIAGNOSIS — Z419 Encounter for procedure for purposes other than remedying health state, unspecified: Secondary | ICD-10-CM

## 2017-04-23 DIAGNOSIS — Z885 Allergy status to narcotic agent status: Secondary | ICD-10-CM

## 2017-04-23 DIAGNOSIS — M4180 Other forms of scoliosis, site unspecified: Secondary | ICD-10-CM | POA: Diagnosis present

## 2017-04-23 DIAGNOSIS — M4326 Fusion of spine, lumbar region: Secondary | ICD-10-CM | POA: Diagnosis not present

## 2017-04-23 DIAGNOSIS — M792 Neuralgia and neuritis, unspecified: Secondary | ICD-10-CM | POA: Diagnosis not present

## 2017-04-23 DIAGNOSIS — Y92230 Patient room in hospital as the place of occurrence of the external cause: Secondary | ICD-10-CM | POA: Diagnosis not present

## 2017-04-23 DIAGNOSIS — Z79899 Other long term (current) drug therapy: Secondary | ICD-10-CM | POA: Diagnosis not present

## 2017-04-23 DIAGNOSIS — K219 Gastro-esophageal reflux disease without esophagitis: Secondary | ICD-10-CM | POA: Diagnosis present

## 2017-04-23 DIAGNOSIS — G8929 Other chronic pain: Secondary | ICD-10-CM | POA: Diagnosis present

## 2017-04-23 DIAGNOSIS — M4726 Other spondylosis with radiculopathy, lumbar region: Secondary | ICD-10-CM | POA: Diagnosis not present

## 2017-04-23 DIAGNOSIS — M549 Dorsalgia, unspecified: Secondary | ICD-10-CM | POA: Diagnosis not present

## 2017-04-23 DIAGNOSIS — G3189 Other specified degenerative diseases of nervous system: Secondary | ICD-10-CM | POA: Diagnosis not present

## 2017-04-23 DIAGNOSIS — M48062 Spinal stenosis, lumbar region with neurogenic claudication: Principal | ICD-10-CM | POA: Diagnosis present

## 2017-04-23 DIAGNOSIS — M4150 Other secondary scoliosis, site unspecified: Secondary | ICD-10-CM

## 2017-04-23 DIAGNOSIS — I10 Essential (primary) hypertension: Secondary | ICD-10-CM | POA: Diagnosis present

## 2017-04-23 DIAGNOSIS — M4186 Other forms of scoliosis, lumbar region: Secondary | ICD-10-CM | POA: Diagnosis not present

## 2017-04-23 DIAGNOSIS — F17210 Nicotine dependence, cigarettes, uncomplicated: Secondary | ICD-10-CM | POA: Diagnosis present

## 2017-04-23 DIAGNOSIS — Z4782 Encounter for orthopedic aftercare following scoliosis surgery: Secondary | ICD-10-CM | POA: Diagnosis not present

## 2017-04-23 SURGERY — POSTERIOR LUMBAR FUSION 2 LEVEL
Anesthesia: General | Site: Back

## 2017-04-23 MED ORDER — GABAPENTIN 300 MG PO CAPS
300.0000 mg | ORAL_CAPSULE | Freq: Three times a day (TID) | ORAL | Status: DC
  Administered 2017-04-23 – 2017-04-25 (×6): 300 mg via ORAL
  Filled 2017-04-23 (×6): qty 1

## 2017-04-23 MED ORDER — CYCLOBENZAPRINE HCL 10 MG PO TABS
10.0000 mg | ORAL_TABLET | Freq: Three times a day (TID) | ORAL | Status: DC | PRN
  Administered 2017-04-23 – 2017-04-24 (×4): 10 mg via ORAL
  Filled 2017-04-23 (×5): qty 1

## 2017-04-23 MED ORDER — BUPIVACAINE-EPINEPHRINE (PF) 0.5% -1:200000 IJ SOLN
INTRAMUSCULAR | Status: AC
  Filled 2017-04-23: qty 30

## 2017-04-23 MED ORDER — BACITRACIN ZINC 500 UNIT/GM EX OINT
TOPICAL_OINTMENT | CUTANEOUS | Status: DC | PRN
  Administered 2017-04-23: 1 via TOPICAL

## 2017-04-23 MED ORDER — THROMBIN 20000 UNITS EX SOLR
CUTANEOUS | Status: DC | PRN
  Administered 2017-04-23: 08:00:00 via TOPICAL

## 2017-04-23 MED ORDER — CEFAZOLIN SODIUM-DEXTROSE 2-4 GM/100ML-% IV SOLN
2.0000 g | Freq: Three times a day (TID) | INTRAVENOUS | Status: AC
  Administered 2017-04-23 – 2017-04-24 (×2): 2 g via INTRAVENOUS
  Filled 2017-04-23 (×2): qty 100

## 2017-04-23 MED ORDER — DEXAMETHASONE SODIUM PHOSPHATE 10 MG/ML IJ SOLN
INTRAMUSCULAR | Status: AC
  Filled 2017-04-23: qty 1

## 2017-04-23 MED ORDER — BUPIVACAINE LIPOSOME 1.3 % IJ SUSP
INTRAMUSCULAR | Status: DC | PRN
  Administered 2017-04-23: 20 mL

## 2017-04-23 MED ORDER — INFLUENZA VAC SPLIT HIGH-DOSE 0.5 ML IM SUSY
0.5000 mL | PREFILLED_SYRINGE | INTRAMUSCULAR | Status: DC
  Filled 2017-04-23: qty 0.5

## 2017-04-23 MED ORDER — ZOLPIDEM TARTRATE 5 MG PO TABS: 5.0000 mg | ORAL_TABLET | Freq: Every evening | ORAL | Status: DC | PRN

## 2017-04-23 MED ORDER — ONDANSETRON HCL 4 MG/2ML IJ SOLN
INTRAMUSCULAR | Status: AC
  Filled 2017-04-23: qty 2

## 2017-04-23 MED ORDER — PHENYLEPHRINE 40 MCG/ML (10ML) SYRINGE FOR IV PUSH (FOR BLOOD PRESSURE SUPPORT)
PREFILLED_SYRINGE | INTRAVENOUS | Status: AC
  Filled 2017-04-23: qty 10

## 2017-04-23 MED ORDER — ONDANSETRON HCL 4 MG/2ML IJ SOLN: 4.0000 mg | Freq: Four times a day (QID) | INTRAMUSCULAR | Status: DC | PRN

## 2017-04-23 MED ORDER — PROMETHAZINE HCL 25 MG/ML IJ SOLN: 6.2500 mg | INTRAMUSCULAR | Status: DC | PRN

## 2017-04-23 MED ORDER — FENTANYL CITRATE (PF) 250 MCG/5ML IJ SOLN
INTRAMUSCULAR | Status: AC
  Filled 2017-04-23: qty 5

## 2017-04-23 MED ORDER — PROPOFOL 10 MG/ML IV BOLUS
INTRAVENOUS | Status: AC
  Filled 2017-04-23: qty 20

## 2017-04-23 MED ORDER — LACTATED RINGERS IV SOLN
INTRAVENOUS | Status: DC | PRN
  Administered 2017-04-23 (×2): via INTRAVENOUS

## 2017-04-23 MED ORDER — ONDANSETRON HCL 4 MG PO TABS: 4.0000 mg | ORAL_TABLET | Freq: Four times a day (QID) | ORAL | Status: DC | PRN

## 2017-04-23 MED ORDER — MENTHOL 3 MG MT LOZG: 1.0000 | LOZENGE | OROMUCOSAL | Status: DC | PRN

## 2017-04-23 MED ORDER — SODIUM CHLORIDE 0.9% FLUSH: 3.0000 mL | Freq: Two times a day (BID) | INTRAVENOUS | Status: DC

## 2017-04-23 MED ORDER — BACITRACIN 50000 UNITS IM SOLR
INTRAMUSCULAR | Status: DC | PRN
  Administered 2017-04-23: 08:00:00

## 2017-04-23 MED ORDER — FENTANYL CITRATE (PF) 100 MCG/2ML IJ SOLN
INTRAMUSCULAR | Status: AC
  Filled 2017-04-23: qty 2

## 2017-04-23 MED ORDER — SUGAMMADEX SODIUM 200 MG/2ML IV SOLN
INTRAVENOUS | Status: DC | PRN
  Administered 2017-04-23: 50 mg via INTRAVENOUS

## 2017-04-23 MED ORDER — EPHEDRINE 5 MG/ML INJ
INTRAVENOUS | Status: AC
  Filled 2017-04-23: qty 10

## 2017-04-23 MED ORDER — FENTANYL CITRATE (PF) 100 MCG/2ML IJ SOLN
INTRAMUSCULAR | Status: DC | PRN
  Administered 2017-04-23: 100 ug via INTRAVENOUS
  Administered 2017-04-23 (×6): 25 ug via INTRAVENOUS

## 2017-04-23 MED ORDER — MEPERIDINE HCL 25 MG/ML IJ SOLN: 6.2500 mg | INTRAMUSCULAR | Status: DC | PRN

## 2017-04-23 MED ORDER — PANTOPRAZOLE SODIUM 40 MG PO TBEC
40.0000 mg | DELAYED_RELEASE_TABLET | Freq: Every day | ORAL | Status: DC
  Administered 2017-04-23 – 2017-04-25 (×3): 40 mg via ORAL
  Filled 2017-04-23 (×3): qty 1

## 2017-04-23 MED ORDER — MIDAZOLAM HCL 2 MG/2ML IJ SOLN
INTRAMUSCULAR | Status: AC
  Filled 2017-04-23: qty 2

## 2017-04-23 MED ORDER — SODIUM CHLORIDE 0.9 % IV SOLN: 250.0000 mL | INTRAVENOUS | Status: DC

## 2017-04-23 MED ORDER — ROCURONIUM BROMIDE 100 MG/10ML IV SOLN
INTRAVENOUS | Status: DC | PRN
  Administered 2017-04-23: 50 mg via INTRAVENOUS

## 2017-04-23 MED ORDER — LIDOCAINE HCL (CARDIAC) 20 MG/ML IV SOLN
INTRAVENOUS | Status: DC | PRN
  Administered 2017-04-23: 100 mg via INTRAVENOUS

## 2017-04-23 MED ORDER — ONDANSETRON HCL 4 MG/2ML IJ SOLN
INTRAMUSCULAR | Status: DC | PRN
  Administered 2017-04-23: 4 mg via INTRAVENOUS

## 2017-04-23 MED ORDER — SCOPOLAMINE 1 MG/3DAYS TD PT72
MEDICATED_PATCH | TRANSDERMAL | Status: DC | PRN
  Administered 2017-04-23: 1 via TRANSDERMAL

## 2017-04-23 MED ORDER — GLYCOPYRROLATE 0.2 MG/ML IJ SOLN
INTRAMUSCULAR | Status: DC | PRN
  Administered 2017-04-23 (×2): 0.2 mg via INTRAVENOUS

## 2017-04-23 MED ORDER — DOCUSATE SODIUM 100 MG PO CAPS
100.0000 mg | ORAL_CAPSULE | Freq: Two times a day (BID) | ORAL | Status: DC
  Administered 2017-04-23 – 2017-04-25 (×4): 100 mg via ORAL
  Filled 2017-04-23 (×4): qty 1

## 2017-04-23 MED ORDER — HYDROMORPHONE HCL 2 MG PO TABS
2.0000 mg | ORAL_TABLET | ORAL | Status: DC | PRN
  Administered 2017-04-23 – 2017-04-24 (×4): 2 mg via ORAL
  Filled 2017-04-23 (×4): qty 1

## 2017-04-23 MED ORDER — ACETAMINOPHEN 325 MG PO TABS
650.0000 mg | ORAL_TABLET | ORAL | Status: DC | PRN
  Administered 2017-04-24 – 2017-04-25 (×5): 650 mg via ORAL
  Filled 2017-04-23 (×5): qty 2

## 2017-04-23 MED ORDER — ACETAMINOPHEN 10 MG/ML IV SOLN: 1000.0000 mg | Freq: Once | INTRAVENOUS | Status: DC | PRN

## 2017-04-23 MED ORDER — PHENOL 1.4 % MT LIQD: 1.0000 | OROMUCOSAL | Status: DC | PRN

## 2017-04-23 MED ORDER — ROCURONIUM BROMIDE 10 MG/ML (PF) SYRINGE
PREFILLED_SYRINGE | INTRAVENOUS | Status: AC
  Filled 2017-04-23: qty 5

## 2017-04-23 MED ORDER — BISACODYL 10 MG RE SUPP: 10.0000 mg | Freq: Every day | RECTAL | Status: DC | PRN

## 2017-04-23 MED ORDER — MIDAZOLAM HCL 5 MG/5ML IJ SOLN
INTRAMUSCULAR | Status: DC | PRN
  Administered 2017-04-23: 2 mg via INTRAVENOUS

## 2017-04-23 MED ORDER — ACETAMINOPHEN 650 MG RE SUPP: 650.0000 mg | RECTAL | Status: DC | PRN

## 2017-04-23 MED ORDER — PROPOFOL 10 MG/ML IV BOLUS
INTRAVENOUS | Status: DC | PRN
  Administered 2017-04-23: 150 mg via INTRAVENOUS

## 2017-04-23 MED ORDER — CEFAZOLIN SODIUM-DEXTROSE 2-4 GM/100ML-% IV SOLN
INTRAVENOUS | Status: AC
  Filled 2017-04-23: qty 100

## 2017-04-23 MED ORDER — SUCCINYLCHOLINE CHLORIDE 200 MG/10ML IV SOSY
PREFILLED_SYRINGE | INTRAVENOUS | Status: AC
  Filled 2017-04-23: qty 10

## 2017-04-23 MED ORDER — LIDOCAINE 2% (20 MG/ML) 5 ML SYRINGE
INTRAMUSCULAR | Status: AC
  Filled 2017-04-23: qty 5

## 2017-04-23 MED ORDER — VANCOMYCIN HCL 1000 MG IV SOLR
INTRAVENOUS | Status: DC | PRN
  Administered 2017-04-23: 1000 mg via TOPICAL

## 2017-04-23 MED ORDER — PHENYLEPHRINE HCL 10 MG/ML IJ SOLN
INTRAVENOUS | Status: DC | PRN
  Administered 2017-04-23: 15 ug/min via INTRAVENOUS

## 2017-04-23 MED ORDER — VANCOMYCIN HCL 1000 MG IV SOLR
INTRAVENOUS | Status: AC
  Filled 2017-04-23: qty 1000

## 2017-04-23 MED ORDER — BUPIVACAINE LIPOSOME 1.3 % IJ SUSP
20.0000 mL | Freq: Once | INTRAMUSCULAR | Status: DC
  Filled 2017-04-23: qty 20

## 2017-04-23 MED ORDER — SUGAMMADEX SODIUM 200 MG/2ML IV SOLN
INTRAVENOUS | Status: AC
  Filled 2017-04-23: qty 2

## 2017-04-23 MED ORDER — DEXAMETHASONE SODIUM PHOSPHATE 10 MG/ML IJ SOLN
INTRAMUSCULAR | Status: DC | PRN
  Administered 2017-04-23: 10 mg via INTRAVENOUS

## 2017-04-23 MED ORDER — 0.9 % SODIUM CHLORIDE (POUR BTL) OPTIME
TOPICAL | Status: DC | PRN
  Administered 2017-04-23: 1000 mL

## 2017-04-23 MED ORDER — CHLORHEXIDINE GLUCONATE CLOTH 2 % EX PADS: 6.0000 | MEDICATED_PAD | Freq: Once | CUTANEOUS | Status: DC

## 2017-04-23 MED ORDER — METOPROLOL TARTRATE 25 MG PO TABS
25.0000 mg | ORAL_TABLET | Freq: Two times a day (BID) | ORAL | Status: DC
  Administered 2017-04-23 – 2017-04-25 (×3): 25 mg via ORAL
  Filled 2017-04-23 (×3): qty 1

## 2017-04-23 MED ORDER — THROMBIN 5000 UNITS EX SOLR
CUTANEOUS | Status: AC
  Filled 2017-04-23: qty 5000

## 2017-04-23 MED ORDER — SODIUM CHLORIDE 0.9% FLUSH: 3.0000 mL | INTRAVENOUS | Status: DC | PRN

## 2017-04-23 MED ORDER — MORPHINE SULFATE (PF) 4 MG/ML IV SOLN
4.0000 mg | INTRAVENOUS | Status: DC | PRN
  Administered 2017-04-23 – 2017-04-25 (×4): 4 mg via INTRAVENOUS
  Filled 2017-04-23 (×4): qty 1

## 2017-04-23 MED ORDER — BUPIVACAINE-EPINEPHRINE (PF) 0.5% -1:200000 IJ SOLN
INTRAMUSCULAR | Status: DC | PRN
  Administered 2017-04-23: 10 mL via PERINEURAL

## 2017-04-23 MED ORDER — BACITRACIN ZINC 500 UNIT/GM EX OINT
TOPICAL_OINTMENT | CUTANEOUS | Status: AC
  Filled 2017-04-23: qty 28.35

## 2017-04-23 MED ORDER — CEFAZOLIN SODIUM-DEXTROSE 2-4 GM/100ML-% IV SOLN
2.0000 g | INTRAVENOUS | Status: AC
  Administered 2017-04-23: 2 g via INTRAVENOUS

## 2017-04-23 MED ORDER — THROMBIN 5000 UNITS EX SOLR
OROMUCOSAL | Status: DC | PRN
  Administered 2017-04-23: 08:00:00 via TOPICAL

## 2017-04-23 MED ORDER — THROMBIN 20000 UNITS EX SOLR
CUTANEOUS | Status: AC
  Filled 2017-04-23: qty 20000

## 2017-04-23 MED ORDER — FENTANYL CITRATE (PF) 100 MCG/2ML IJ SOLN
25.0000 ug | INTRAMUSCULAR | Status: DC | PRN
  Administered 2017-04-23 (×4): 25 ug via INTRAVENOUS

## 2017-04-23 MED ORDER — EPHEDRINE SULFATE 50 MG/ML IJ SOLN
INTRAMUSCULAR | Status: DC | PRN
  Administered 2017-04-23 (×5): 5 mg via INTRAVENOUS

## 2017-04-23 SURGICAL SUPPLY — 75 items
BAG DECANTER FOR FLEXI CONT (MISCELLANEOUS) ×3 IMPLANT
BENZOIN TINCTURE PRP APPL 2/3 (GAUZE/BANDAGES/DRESSINGS) ×3 IMPLANT
BLADE CLIPPER SURG (BLADE) IMPLANT
BLADE SURG 10 STRL SS (BLADE) ×3 IMPLANT
BUR MATCHSTICK NEURO 3.0 LAGG (BURR) ×3 IMPLANT
BUR PRECISION FLUTE 6.0 (BURR) ×3 IMPLANT
CAGE ALTERA 10X31X9-13 15D (Cage) ×2 IMPLANT
CAGE ALTERA 9-13-15-31MM (Cage) ×1 IMPLANT
CANISTER SUCT 3000ML PPV (MISCELLANEOUS) ×3 IMPLANT
CAP REVERE LOCKING (Cap) ×18 IMPLANT
CARTRIDGE OIL MAESTRO DRILL (MISCELLANEOUS) ×1 IMPLANT
CLOSURE WOUND 1/2 X4 (GAUZE/BANDAGES/DRESSINGS) ×1
CONT SPEC 4OZ CLIKSEAL STRL BL (MISCELLANEOUS) ×3 IMPLANT
COVER BACK TABLE 60X90IN (DRAPES) ×6 IMPLANT
DIFFUSER DRILL AIR PNEUMATIC (MISCELLANEOUS) ×3 IMPLANT
DRAPE C-ARM 42X72 X-RAY (DRAPES) ×6 IMPLANT
DRAPE HALF SHEET 40X57 (DRAPES) ×3 IMPLANT
DRAPE LAPAROTOMY 100X72X124 (DRAPES) ×3 IMPLANT
DRAPE POUCH INSTRU U-SHP 10X18 (DRAPES) ×3 IMPLANT
DRAPE SURG 17X23 STRL (DRAPES) ×12 IMPLANT
ELECT BLADE 4.0 EZ CLEAN MEGAD (MISCELLANEOUS) ×3
ELECT CAUTERY BLADE 6.4 (BLADE) ×3 IMPLANT
ELECT REM PT RETURN 9FT ADLT (ELECTROSURGICAL) ×3
ELECTRODE BLDE 4.0 EZ CLN MEGD (MISCELLANEOUS) ×1 IMPLANT
ELECTRODE REM PT RTRN 9FT ADLT (ELECTROSURGICAL) ×1 IMPLANT
GAUZE SPONGE 4X4 12PLY STRL (GAUZE/BANDAGES/DRESSINGS) ×3 IMPLANT
GAUZE SPONGE 4X4 12PLY STRL LF (GAUZE/BANDAGES/DRESSINGS) ×3 IMPLANT
GAUZE SPONGE 4X4 16PLY XRAY LF (GAUZE/BANDAGES/DRESSINGS) ×3 IMPLANT
GLOVE BIO SURGEON STRL SZ8 (GLOVE) ×6 IMPLANT
GLOVE BIO SURGEON STRL SZ8.5 (GLOVE) ×6 IMPLANT
GLOVE BIOGEL PI IND STRL 7.5 (GLOVE) ×1 IMPLANT
GLOVE BIOGEL PI IND STRL 8 (GLOVE) ×1 IMPLANT
GLOVE BIOGEL PI INDICATOR 7.5 (GLOVE) ×2
GLOVE BIOGEL PI INDICATOR 8 (GLOVE) ×2
GLOVE ECLIPSE 7.5 STRL STRAW (GLOVE) ×9 IMPLANT
GLOVE EXAM NITRILE LRG STRL (GLOVE) IMPLANT
GLOVE EXAM NITRILE XL STR (GLOVE) IMPLANT
GLOVE EXAM NITRILE XS STR PU (GLOVE) IMPLANT
GLOVE SURG SS PI 7.5 STRL IVOR (GLOVE) ×12 IMPLANT
GOWN STRL REUS W/ TWL LRG LVL3 (GOWN DISPOSABLE) ×2 IMPLANT
GOWN STRL REUS W/ TWL XL LVL3 (GOWN DISPOSABLE) ×6 IMPLANT
GOWN STRL REUS W/TWL 2XL LVL3 (GOWN DISPOSABLE) IMPLANT
GOWN STRL REUS W/TWL LRG LVL3 (GOWN DISPOSABLE) ×4
GOWN STRL REUS W/TWL XL LVL3 (GOWN DISPOSABLE) ×12
HEMOSTAT POWDER KIT SURGIFOAM (HEMOSTASIS) ×3 IMPLANT
KIT BASIN OR (CUSTOM PROCEDURE TRAY) ×3 IMPLANT
KIT ROOM TURNOVER OR (KITS) ×3 IMPLANT
NEEDLE HYPO 21X1.5 SAFETY (NEEDLE) IMPLANT
NEEDLE HYPO 22GX1.5 SAFETY (NEEDLE) ×3 IMPLANT
NS IRRIG 1000ML POUR BTL (IV SOLUTION) ×3 IMPLANT
OIL CARTRIDGE MAESTRO DRILL (MISCELLANEOUS) ×3
PACK LAMINECTOMY NEURO (CUSTOM PROCEDURE TRAY) ×3 IMPLANT
PAD ARMBOARD 7.5X6 YLW CONV (MISCELLANEOUS) ×9 IMPLANT
PATTIES SURGICAL .5 X.5 (GAUZE/BANDAGES/DRESSINGS) ×3 IMPLANT
PATTIES SURGICAL .5 X1 (DISPOSABLE) IMPLANT
PATTIES SURGICAL 1X1 (DISPOSABLE) ×3 IMPLANT
PENCIL BUTTON HOLSTER BLD 10FT (ELECTRODE) ×3 IMPLANT
ROD REVERE 7.5MM (Rod) ×6 IMPLANT
SCREW REVERE 6.35 6.5MMX45 (Screw) ×12 IMPLANT
SCREW REVERE 6.5X50MM (Screw) ×6 IMPLANT
SPACER ALTERA 10X31 8-12MM-8 (Spacer) ×3 IMPLANT
SPONGE LAP 4X18 X RAY DECT (DISPOSABLE) IMPLANT
SPONGE NEURO XRAY DETECT 1X3 (DISPOSABLE) IMPLANT
SPONGE SURGIFOAM ABS GEL 100 (HEMOSTASIS) ×3 IMPLANT
STRIP BIOACTIVE 20CC 25X100X8 (Miscellaneous) ×6 IMPLANT
STRIP CLOSURE SKIN 1/2X4 (GAUZE/BANDAGES/DRESSINGS) ×2 IMPLANT
SUT VIC AB 1 CT1 18XBRD ANBCTR (SUTURE) ×2 IMPLANT
SUT VIC AB 1 CT1 8-18 (SUTURE) ×4
SUT VIC AB 2-0 CP2 18 (SUTURE) ×6 IMPLANT
SYR CONTROL 10ML LL (SYRINGE) ×3 IMPLANT
TAPE CLOTH SURG 4X10 WHT LF (GAUZE/BANDAGES/DRESSINGS) ×3 IMPLANT
TOWEL GREEN STERILE (TOWEL DISPOSABLE) ×3 IMPLANT
TOWEL GREEN STERILE FF (TOWEL DISPOSABLE) ×3 IMPLANT
TRAY FOLEY W/METER SILVER 16FR (SET/KITS/TRAYS/PACK) ×3 IMPLANT
WATER STERILE IRR 1000ML POUR (IV SOLUTION) ×3 IMPLANT

## 2017-04-23 NOTE — Evaluation (Signed)
Physical Therapy Evaluation Patient Details Name: Ana Alvarado MRN: [id number] DOB: 07/04/1941 Today's Date: 04/23/2017   History of Present Illness  Pt is a 76 y/o female s/p L3-5 PLIF. PMH includes anxiety, depression, and HTN.   Clinical Impression  Patient is s/p above surgery resulting in the deficits listed below (see PT Problem List). PTA, pt was independent with functional mobility. Upon eval, pt very limited by nausea. Only able to attempt standing this session and had nausea and requested to lay back down. Required min A for mobility this session. Reports she will be going to SNF at [facility] in [place] at d/c. Feel this is appropriate to increase independence and safety with functional mobility.  Patient will benefit from skilled PT to increase their independence and safety with mobility (while adhering to their precautions) to allow discharge to the venue listed below.     Follow Up Recommendations SNF;Supervision/Assistance - 24 hour    Equipment Recommendations  Other (comment) (TBD)    Recommendations for Other Services       Precautions / Restrictions Precautions Precautions: Back Precaution Booklet Issued: Yes (comment) Precaution Comments: Reviewed back precaution handout with pt.  Required Braces or Orthoses: Spinal Brace Spinal Brace: Lumbar corset;Applied in sitting position Restrictions Weight Bearing Restrictions: No      Mobility  Bed Mobility Overal bed mobility: Needs Assistance Bed Mobility: Sit to Sidelying         Sit to sidelying: Min assist General bed mobility comments: Min A for LE lift assist. Requesting to stay in sidelying so pillow placed between LEs. Educated about use of log roll technique.   Transfers Overall transfer level: Needs assistance Equipment used: None Transfers: Sit to/from Stand Sit to Stand: Min assist         General transfer comment: Min A for lift assist and steadying. Verbal cues to power through LEs. Upon  standing, pt feeling nauseous and refused further mobility. Requested to lay back down.   Ambulation/Gait             General Gait Details: Deferred secondary to nausea.  Stairs            Wheelchair Mobility    Modified Rankin (Stroke Patients Only)       Balance Overall balance assessment: Needs assistance Sitting-balance support: No upper extremity supported;Feet supported Sitting balance-Leahy Scale: Good     Standing balance support: No upper extremity supported Standing balance-Leahy Scale: Fair Standing balance comment: static standing                              Pertinent Vitals/Pain Pain Assessment: 0-10 Pain Score: 10-Worst pain ever Pain Location: back  Pain Descriptors / Indicators: Aching;Operative site guarding Pain Intervention(s): Limited activity within patient's tolerance;Monitored during session;Repositioned    Home Living Family/patient expects to be discharged to:: Skilled nursing facility Living Arrangements: Non-relatives/Friends Available Help at Discharge: Family;Available 24 hours/day Type of Home: House Home Access: Stairs to enter Entrance Stairs-Rails: Right;Left;Can reach both Entrance Stairs-Number of Steps: 6 Home Layout: One level Home Equipment: None Additional Comments: Pt reports she will be going to SNF     Prior Function Level of Independence: Independent               Hand Dominance        Extremity/Trunk Assessment   Upper Extremity Assessment Upper Extremity Assessment: Defer to OT evaluation    Lower Extremity Assessment Lower Extremity Assessment:  RLE deficits/detail RLE Deficits / Details: Pain from hip to feet at baseline.     Cervical / Trunk Assessment Cervical / Trunk Assessment: Other exceptions Cervical / Trunk Exceptions: s/p PLIF   Communication   Communication: No difficulties  Cognition Arousal/Alertness: Awake/alert Behavior During Therapy: WFL for tasks  assessed/performed Overall Cognitive Status: Within Functional Limits for tasks assessed                                        General Comments General comments (skin integrity, edema, etc.): Educated about generalized walking program. Pt reports she will be going to SNF at d/c to increase safety with mobility.     Exercises     Assessment/Plan    PT Assessment Patient needs continued PT services  PT Problem List Decreased strength;Decreased balance;Decreased mobility;Decreased activity tolerance;Decreased knowledge of use of DME;Decreased knowledge of precautions;Pain       PT Treatment Interventions DME instruction;Functional mobility training;Stair training;Gait training;Therapeutic exercise;Therapeutic activities;Balance training;Neuromuscular re-education;Patient/family education    PT Goals (Current goals can be found in the Care Plan section)  Acute Rehab PT Goals Patient Stated Goal: to go to SNF at d/c to get more independence.  PT Goal Formulation: With patient Time For Goal Achievement: 04/30/17 Potential to Achieve Goals: Good    Frequency Min 5X/week   Barriers to discharge        Co-evaluation               AM-PAC PT "6 Clicks" Daily Activity  Outcome Measure Difficulty turning over in bed (including adjusting bedclothes, sheets and blankets)?: A Little Difficulty moving from lying on back to sitting on the side of the bed? : Unable Difficulty sitting down on and standing up from a chair with arms (e.g., wheelchair, bedside commode, etc,.)?: Unable Help needed moving to and from a bed to chair (including a wheelchair)?: A Little Help needed walking in hospital room?: A Little Help needed climbing 3-5 steps with a railing? : A Lot 6 Click Score: 13    End of Session Equipment Utilized During Treatment: Gait belt;Back brace Activity Tolerance: Treatment limited secondary to medical complications (Comment) (nausea ) Patient left: in  bed;with call bell/phone within reach Nurse Communication: Mobility status;Other (comment) (nausea ) PT Visit Diagnosis: Unsteadiness on feet (R26.81);Muscle weakness (generalized) (M62.81);Pain Pain - Right/Left: Right Pain - part of body: Leg (back )    Time: 0947-0962 PT Time Calculation (min) (ACUTE ONLY): 18 min   Charges:   PT Evaluation $PT Eval Moderate Complexity: 1 Mod     PT G Codes:        Leighton Ruff, PT, DPT  Acute Rehabilitation Services  Pager: [phone]   Rudean Hitt 04/23/2017, 6:46 PM

## 2017-04-23 NOTE — H&P (Signed)
Subjective:  The patient is a 76 year old white female who has complained of back and leg pain consistent with neurogenic claudication. She has failed medical management and was worked up with a lumbar MRI lumbar x-rays. 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 I discussed the various treatment options with the patient including surgery. She has weighed the risks, benefits, and alternatives to surgery and decided proceed with an L3-4 and L4-5 decompression, instrumentation, and fusion.    Past Medical History:  Diagnosis Date  . Anxiety   . Depression   . GERD (gastroesophageal reflux disease)    not on medication  . History of blood transfusion 1968   after miscarriage  . Hypertension   . PONV (postoperative nausea and vomiting)     Past Surgical History:  Procedure Laterality Date  . ABDOMINAL HYSTERECTOMY    . HERNIA REPAIR  1959   right inguinal  . TUBAL LIGATION      Allergies  Allergen Reactions  . Aleve [Naproxen Sodium] Hives  . Codeine Nausea And Vomiting  . Hydrocodone Nausea And Vomiting  . Oxycodone Nausea And Vomiting    Social History  Substance Use Topics  . Smoking status: Current Every Day Smoker    Packs/day: 0.40    Years: 50.00    Types: Cigarettes  . Smokeless tobacco: Never Used  . Alcohol use 8.4 oz/week    14 Cans of beer per week    Family History  Problem Relation Age of Onset  . Heart disease Mother   . Heart disease Father   . Parkinson's disease Brother    Prior to Admission medications   Medication Sig Start Date End Date Taking? Authorizing Provider  diazepam (VALIUM) 2 MG tablet Take 2 mg by mouth at bedtime.   Yes [provider]  DiphenhydrAMINE HCl, Sleep, (SLEEP-AID MAXIMUM STRENGTH) 50 MG CAPS Take 50 mg by mouth at bedtime.   Yes [provider]  gabapentin (NEURONTIN) 300 MG capsule Take 300 mg by mouth 3 (three) times daily.   Yes [provider]  ibuprofen  (ADVIL,MOTRIN) 800 MG tablet Take 800 mg by mouth daily as needed for headache (pain).   Yes [provider]  metoprolol tartrate (LOPRESSOR) 25 MG tablet Take 25 mg by mouth 2 (two) times daily. 03/25/17  Yes [provider]  pantoprazole (PROTONIX) 40 MG tablet Take 40 mg by mouth daily.   Yes [provider]  ranitidine (ZANTAC) 150 MG tablet Take 150 mg by mouth daily as needed for heartburn (indigestion).   Yes [provider]  metoCLOPramide (REGLAN) 10 MG tablet Take 1 tablet (10 mg total) by mouth every 6 (six) hours. Patient not taking: Reported on 11/11/7351 29/92/42   Delora Fuel, MD  potassium chloride SA (K-DUR,KLOR-CON) 20 MEQ tablet Take 1 tablet (20 mEq total) by mouth 2 (two) times daily. Patient not taking: Reported on 6/83/4196 22/29/79   Delora Fuel, MD     Review of Systems  Positive ROS: As above  All other systems have been reviewed and were otherwise negative with the exception of those mentioned in the HPI and as above.  Objective: Vital signs in last 24 hours: Temp:  [98.2 F (36.8 C)] 98.2 F (36.8 C) (09/20 0543) Pulse Rate:  [46] 46 (09/20 0543) Resp:  [18] 18 (09/20 0543) BP: (175)/(45) 175/45 (09/20 0545) SpO2:  [99 %] 99 % (09/20 0543) Weight:  [60.3 kg (133 lb)] 60.3 kg (133 lb) (09/20 0543)  General  Appearance: Alert Head: Normocephalic, without obvious abnormality, atraumatic Eyes: PERRL, conjunctiva/corneas clear, EOM's intact,    Ears: Normal  Throat: Normal  Neck: Supple, Back: unremarkable Lungs: Clear to auscultation bilaterally, respirations unlabored Heart: Regular rate and rhythm, no murmur, rub or gallop Abdomen: Soft, non-tender Extremities: Extremities normal, atraumatic, no cyanosis or edema Skin: unremarkable  NEUROLOGIC:   Mental status: alert and oriented,Motor Exam - grossly normal Sensory Exam - grossly normal Reflexes:  Coordination - grossly normal Gait - grossly normal Balance -  grossly normal Cranial Nerves: I: smell Not tested  II: visual acuity  OS: Normal  OD: Normal   II: visual fields Full to confrontation  II: pupils Equal, round, reactive to light  III,VII: ptosis None  III,IV,VI: extraocular muscles  Full ROM  V: mastication Normal  V: facial light touch sensation  Normal  V,VII: corneal reflex  Present  VII: facial muscle function - upper  Normal  VII: facial muscle function - lower Normal  VIII: hearing Not tested  IX: soft palate elevation  Normal  IX,X: gag reflex Present  XI: trapezius strength  5/5  XI: sternocleidomastoid strength 5/5  XI: neck flexion strength  5/5  XII: tongue strength  Normal    Data Review Lab Results  Component Value Date   WBC 8.3 04/20/2017   HGB 15.0 04/20/2017   HCT 44.6 04/20/2017   MCV 94.9 04/20/2017   PLT 277 04/20/2017   Lab Results  Component Value Date   NA 138 04/20/2017   K 3.6 04/20/2017   CL 105 04/20/2017   CO2 28 04/20/2017   BUN 10 04/20/2017   CREATININE 0.69 04/20/2017   GLUCOSE 104 (H) 04/20/2017   No results found for: INR, PROTIME  Assessment/Plan: L3-4 and L4-5 spinal listhesis, spinal stenosis, lumbago, lumbar radiculopathy, neurogenic location: I have discussed the situation with the patient. I have reviewed her imaging studies with her and pointed out the abnormalities. We have discussed the various treatment options including surgery. I have described the surgical treatment option of an L3-4 and L4-5 decompression, instrumentation, and fusion. I have given her a surgical pamphlet and shown her surgical models. We have discussed the risks, benefits, alternatives, expected postoperative course, and likelihood of achieving her goals with surgery. I have answered all patient's questions. She has decided to proceed with surgery.   Pablo Stauffer D 04/23/2017 7:24 AM

## 2017-04-23 NOTE — Anesthesia Procedure Notes (Signed)
Procedure Name: Intubation Date/Time: 04/23/2017 7:45 AM Performed by: Shirlyn Goltz Pre-anesthesia Checklist: Patient identified, Emergency Drugs available, Suction available and Patient being monitored Patient Re-evaluated:Patient Re-evaluated prior to induction Oxygen Delivery Method: Circle system utilized Preoxygenation: Pre-oxygenation with 100% oxygen Induction Type: IV induction Ventilation: Mask ventilation without difficulty Laryngoscope Size: Mac and 3 Grade View: Grade I Tube type: Oral Tube size: 7.0 mm Number of attempts: 1 Airway Equipment and Method: Stylet Placement Confirmation: ETT inserted through vocal cords under direct vision,  positive ETCO2 and breath sounds checked- equal and bilateral Secured at: 21 cm Tube secured with: Tape Dental Injury: Teeth and Oropharynx as per pre-operative assessment

## 2017-04-23 NOTE — Transfer of Care (Signed)
Immediate Anesthesia Transfer of Care Note  Patient: Ana Alvarado  Procedure(s) Performed: Procedure(s): POSTERIOR LUMBAR INTERBODY FUSION , INTERBODY PROSTHESIS, POSTERIOR INSTRUMENTATION LUMBAR THREE - LUMBAR FOUR , LUMBAR FOUR-  LUMBAR FIVE (N/A)  Patient Location: PACU  Anesthesia Type:General  Level of Consciousness: awake, alert , oriented and patient cooperative  Airway & Oxygen Therapy: Patient Spontanous Breathing and Patient connected to nasal cannula oxygen  Post-op Assessment: Report given to RN and Post -op Vital signs reviewed and stable  Post vital signs: Reviewed and stable  Last Vitals:  Vitals:   04/23/17 0543 04/23/17 0545  BP:  (!) 175/45  Pulse: (!) 46   Resp: 18   Temp: 36.8 C   SpO2: 99%     Last Pain:  Vitals:   04/23/17 0556  TempSrc:   PainSc: 4       Patients Stated Pain Goal: 5 (02/63/78 5885)  Complications: No apparent anesthesia complications

## 2017-04-23 NOTE — Anesthesia Postprocedure Evaluation (Signed)
Anesthesia Post Note  Patient: KORALYNN GREENSPAN  Procedure(s) Performed: Procedure(s) (LRB): POSTERIOR LUMBAR INTERBODY FUSION , INTERBODY PROSTHESIS, POSTERIOR INSTRUMENTATION LUMBAR THREE - LUMBAR FOUR , LUMBAR FOUR-  LUMBAR FIVE (N/A)     Patient location during evaluation: PACU Anesthesia Type: General Level of consciousness: awake and sedated Pain management: pain level controlled Vital Signs Assessment: post-procedure vital signs reviewed and stable Respiratory status: spontaneous breathing Cardiovascular status: stable Postop Assessment: no apparent nausea or vomiting Anesthetic complications: no    Last Vitals:  Vitals:   04/23/17 1315 04/23/17 1319  BP:  103/77  Pulse: 84 81  Resp: 18 16  Temp:    SpO2: 95% 96%    Last Pain:  Vitals:   04/23/17 1300  TempSrc:   PainSc: 10-Worst pain ever   Pain Goal: Patients Stated Pain Goal: 5 (04/23/17 0556)    LLE Sensation: Full sensation (04/23/17 1330)   RLE Sensation: Full sensation (04/23/17 1330)      Jermiah Soderman JR,JOHN Dewanda Fennema

## 2017-04-23 NOTE — Addendum Note (Signed)
Addendum  created 04/23/17 1521 by Myna Bright, CRNA   Anesthesia Intra Flowsheets edited

## 2017-04-23 NOTE — Op Note (Signed)
Brief history: The patient is a 76 year old white female who has complained of chronic back and leg pain consistent with neurogenic claudication. She has failed medical management and was worked up with a lumbar MRI and lumbar x-rays. This demonstrated a thoracolumbar scoliosis and lumbar spondylolisthesis with spinal stenosis. I discussed the various treatment options with the patient including surgery. She has weighed the risks, benefits, and alternatives to surgery and decided to proceed with a L3-4 and L4-5 decompression, instrumentation, and fusion.   Preoperative diagnosis: Adult degenerative scoliosis, L3-4 and L4-5 spondylolisthesis, Degenerative disc disease, spinal stenosis compressing both the L4 and the L5 nerve roots; lumbago; lumbar radiculopathy; neurogenic claudication  Postoperative diagnosis: The same  Procedure: Bilateral L3-4 and L4-5 Laminotomy/foraminotomies to decompress the bilateral L3, L4 and L5 nerve roots(the work required to do this was in addition to the work required to do the posterior lumbar interbody fusion because of the patient's spinal stenosis, facet arthropathy. Etc. requiring a wide decompression of the nerve roots.); L3-4 and L4-5 transforaminal lumbar interbody fusion with local morselized autograft bone and Kinnex graft extender; insertion of interbody prosthesis at L3-4 and L4-5 (globus peek expandable interbody prosthesis); posterior segmental instrumentation from L3 to L5 with globus titanium pedicle screws and rods; posterior lateral arthrodesis at L3-4 and L4-5 with local morselized autograft bone and Kinnex bone graft extender.  Surgeon: Dr. Earle Gell  Asst.: Dr. Sherwood Gambler  Anesthesia: Gen. endotracheal  Estimated blood loss: 350 mL  Drains: None  Complications: None  Description of procedure: The patient was brought to the operating room by the anesthesia team. General endotracheal anesthesia was induced. The patient was turned to the prone  position on the Wilson frame. The patient's lumbosacral region was then prepared with Betadine scrub and Betadine solution. Sterile drapes were applied.  I then injected the area to be incised with Marcaine with epinephrine solution. I then used the scalpel to make a linear midline incision over the L3-4 and L4-5 interspace. I then used electrocautery to perform a bilateral subperiosteal dissection exposing the spinous process and lamina of L3, L4 and L5. We then obtained intraoperative radiograph to confirm our location. We then inserted the Verstrac retractor to provide exposure.  I began the decompression by using the high speed drill to perform laminotomies at L3-4 and L4-5 bilaterally. We then used the Kerrison punches to widen the laminotomy and removed the ligamentum flavum at L3-4 and L4-5 bilaterally. We used the Kerrison punches to remove the medial facets at L3-4 and L4-5 bilaterally. We performed wide foraminotomies about the bilateral L3, L4 and L5 nerve roots completing the decompression.  We now turned our attention to the posterior lumbar interbody fusion. I used a scalpel to incise the intervertebral disc at L3-4 and L4-5 bilaterally. I then performed a partial intervertebral discectomy at L3-4 and L4-5 bilaterally using the pituitary forceps. We prepared the vertebral endplates at I6-9 and G2-9 bilaterally for the fusion by removing the soft tissues with the curettes. We then used the trial spacers to pick the appropriate sized interbody prosthesis. We prefilled his prosthesis with a combination of local morselized autograft bone that we obtained during the decompression as well as Kinnex bone graft extender. We inserted the prefilled prosthesis into the interspace at L3-4 and L4-5 from the left, we then expanded the prosthesis. There was a good snug fit of the prosthesis in the interspace. We then filled and the remainder of the intervertebral disc space with local morselized autograft bone  and Kinnex. This  completed the posterior lumbar interbody arthrodesis.  We now turned attention to the instrumentation. Under fluoroscopic guidance we cannulated the bilateral L3, L4 and L5 pedicles with the bone probe. We then removed the bone probe. We then tapped the pedicle with a 5.5 millimeter tap. We then removed the tap. We probed inside the tapped pedicle with a ball probe to rule out cortical breaches. We then inserted a 6.5 x 45 and 50 millimeter pedicle screw into the L3, L4 and L5 pedicles bilaterally under fluoroscopic guidance. We then palpated along the medial aspect of the pedicles to rule out cortical breaches. There were none. The nerve roots were not injured. We then connected the unilateral pedicle screws with a lordotic rod. We compressed the construct and secured the rod in place with the caps. We then tightened the caps appropriately. This completed the instrumentation from L3-L5 bilaterally.  We now turned our attention to the posterior lateral arthrodesis at L3-4 and L4-5 bilaterally. We used the high-speed drill to decorticate the remainder of the facets, pars, transverse process at L3-4 and L4-5 bilaterally. We then applied a combination of local morselized autograft bone and Kinnex bone graft extender over these decorticated posterior lateral structures. This completed the posterior lateral arthrodesis.  We then obtained hemostasis using bipolar electrocautery. We irrigated the wound out with bacitracin solution. We inspected the thecal sac and nerve roots and noted they were well decompressed. We then removed the retractor. We placed vancomycin powder in the wound. We reapproximated patient's thoracolumbar fascia with interrupted #1 Vicryl suture. We reapproximated patient's subcutaneous tissue with interrupted 2-0 Vicryl suture. The reapproximated patient's skin with Steri-Strips and benzoin. The wound was then coated with bacitracin ointment. A sterile dressing was applied. The  drapes were removed. The patient was subsequently returned to the supine position where they were extubated by the anesthesia team. He was then transported to the post anesthesia care unit in stable condition. All sponge instrument and needle counts were reportedly correct at the end of this case.

## 2017-04-24 ENCOUNTER — Inpatient Hospital Stay (HOSPITAL_COMMUNITY): Payer: Medicare Other

## 2017-04-24 LAB — BASIC METABOLIC PANEL
ANION GAP: 8 (ref 5–15)
Anion gap: 8 (ref 5–15)
BUN: 10 mg/dL (ref 6–20)
BUN: 9 mg/dL (ref 6–20)
CHLORIDE: 105 mmol/L (ref 101–111)
CO2: 25 mmol/L (ref 22–32)
CO2: 25 mmol/L (ref 22–32)
CREATININE: 0.9 mg/dL (ref 0.44–1.00)
Calcium: 8.3 mg/dL — ABNORMAL LOW (ref 8.9–10.3)
Calcium: 8.3 mg/dL — ABNORMAL LOW (ref 8.9–10.3)
Chloride: 103 mmol/L (ref 101–111)
Creatinine, Ser: 0.95 mg/dL (ref 0.44–1.00)
GFR calc Af Amer: >60 mL/min (ref >60)
GFR calc Af Amer: >60 mL/min (ref >60)
GFR calc non Af Amer: 58 mL/min — ABNORMAL LOW (ref >60)
GFR calc non Af Amer: >60 mL/min (ref >60)
Glucose, Bld: 123 mg/dL — ABNORMAL HIGH (ref 65–99)
Glucose, Bld: 124 mg/dL — ABNORMAL HIGH (ref 65–99)
POTASSIUM: 3.5 mmol/L (ref 3.5–5.1)
POTASSIUM: 3 mmol/L — AB (ref 3.5–5.1)
SODIUM: 136 mmol/L (ref 135–145)
Sodium: 138 mmol/L (ref 135–145)

## 2017-04-24 LAB — CBC
HCT: 32.1 % — ABNORMAL LOW (ref 36.0–46.0)
HEMOGLOBIN: 10.7 g/dL — AB (ref 12.0–15.0)
MCH: 31.8 pg (ref 26.0–34.0)
MCHC: 33.3 g/dL (ref 30.0–36.0)
MCV: 95.3 fL (ref 78.0–100.0)
Platelets: 192 10*3/uL (ref 150–400)
RBC: 3.37 MIL/uL — ABNORMAL LOW (ref 3.87–5.11)
RDW: 13 % (ref 11.5–15.5)
WBC: 11.6 10*3/uL — ABNORMAL HIGH (ref 4.0–10.5)

## 2017-04-24 MED ORDER — LACTATED RINGERS IV BOLUS (SEPSIS)
1000.0000 mL | Freq: Once | INTRAVENOUS | Status: AC
  Administered 2017-04-24: 1000 mL via INTRAVENOUS

## 2017-04-24 MED ORDER — TAMSULOSIN HCL 0.4 MG PO CAPS
0.4000 mg | ORAL_CAPSULE | Freq: Every day | ORAL | Status: AC
  Administered 2017-04-24 – 2017-04-25 (×2): 0.4 mg via ORAL
  Filled 2017-04-24 (×2): qty 1

## 2017-04-24 MED FILL — Heparin Sodium (Porcine) Inj 1000 Unit/ML: INTRAMUSCULAR | Qty: 30 | Status: AC

## 2017-04-24 MED FILL — Sodium Chloride IV Soln 0.9%: INTRAVENOUS | Qty: 1000 | Status: AC

## 2017-04-24 NOTE — Clinical Social Work Note (Signed)
Clinical Social Work Assessment  Patient Details  Name: Ana Alvarado MRN: [id number] Date of Birth: [birth year]  Date of referral:  04/24/17               Reason for consult:  Facility Placement                Permission sought to share information with:  Chartered certified accountant granted to share information::  Yes, Verbal Permission Granted  Name::        Agency::  SNF - [facility]  Relationship::     Contact Information:     Housing/Transportation Living arrangements for the past 2 months:  Single Family Home Source of Information:  Patient Patient Interpreter Needed:  None Criminal Activity/Legal Involvement Pertinent to Current Situation/Hospitalization:  No - Comment as needed Significant Relationships:  Friend Lives with:  Friends Do you feel safe going back to the place where you live?  Yes Need for family participation in patient care:  No (Coment)  Care giving concerns: Patient lives at home with friends. PT recommending SNF.   Social Worker assessment / plan: CSW met with patient at bedside. Patient alert and oriented, with pleasant affect. Patient agreeable to SNF and prefers [facility]. [facility] accepted patient. CSW to support with discharge.  Employment status:  Retired Forensic scientist:  [employer]) PT Recommendations:  [place] / Referral to community resources:  [place]  Patient/Family's Response to care: Patient appreciative of care.  Patient/Family's Understanding of and Emotional Response to Diagnosis, Current Treatment, and Prognosis: Patient understanding of SNF recommendation and hopeful for recovery at SNF before returning home.  Emotional Assessment Appearance:  Appears stated age Attitude/Demeanor/Rapport:  Other (appropriate) Affect (typically observed):  Calm, Pleasant Orientation:  Oriented to Self, Oriented to Situation, Oriented to Place, Oriented to   Time Alcohol / Substance use:  Not Applicable Psych involvement (Current and /or in the community):  No (Comment)  Discharge Needs  Concerns to be addressed:  Discharge Planning Concerns Readmission within the last 30 days:  No Current discharge risk:  Physical Impairment Barriers to Discharge:  Continued Medical Work up   Estanislado Emms, LCSW 04/24/2017, 11:55 AM

## 2017-04-24 NOTE — Progress Notes (Signed)
Patient able to void 13ml at 1612. Very lethargic and weak with ambulation. Temp at 101.4. Tylenol given. MD's office notified and ordered BMP stat. Will continue to monitor.

## 2017-04-24 NOTE — Progress Notes (Signed)
Text paged Dr. Kathyrn Sheriff at 2200 about pt status. Will cont. To monitor.

## 2017-04-24 NOTE — Clinical Social Work Placement (Signed)
   CLINICAL SOCIAL WORK PLACEMENT  NOTE  Date:  04/24/2017  Patient Details  Name: Ana Alvarado MRN: [id number] Date of Birth: 11/29/1940  Clinical Social Work is seeking post-discharge placement for this patient at the [place] level of care (*CSW will initial, date and re-position this form in  chart as items are completed):  Yes   Patient/family provided with [place] Work Department's list of facilities offering this level of care within the geographic area requested by the patient (or if unable, by the patient's family).  Yes   Patient/family informed of their freedom to choose among providers that offer the needed level of care, that participate in Medicare, Medicaid or managed care program needed by the patient, have an available bed and are willing to accept the patient.  Yes   Patient/family informed of [facility] [place]'s ownership interest in [facility] and [facility], as well as of the fact that they are under no obligation to receive care at these facilities.  PASRR submitted to EDS on 04/24/17     PASRR number received on 04/24/17     Existing PASRR number confirmed on       FL2 transmitted to all facilities in geographic area requested by pt/family on 04/24/17     FL2 transmitted to all facilities within larger geographic area on       Patient informed that his/her managed care company has contracts with or will negotiate with certain facilities, including the following:  [facility], [place]     Yes   Patient/family informed of bed offers received.  Patient chooses bed at [facility], [facility]     Physician recommends and patient chooses bed at      Patient to be transferred to [place] on 04/25/17.  Patient to be transferred to facility by [facility]     Patient family notified on 04/24/17 of transfer.  Name of family member notified:  Jacqulynn Cadet, friend; Milana Kidney, son     PHYSICIAN       Additional Comment:     _______________________________________________ Estanislado Emms, LCSW 04/24/2017, 1:50 PM

## 2017-04-24 NOTE — Progress Notes (Signed)
Patient is transferred from room 3C06 to unit 5N31 at this time. Report given to receiving nurse Ethelene Browns, RN with all questions answered. Transported via bed with all belongings at side.

## 2017-04-24 NOTE — Progress Notes (Signed)
Physical Therapy Treatment Patient Details Name: Ana Alvarado MRN: [id number] DOB: January 14, 1941 Today's Date: 04/24/2017    History of Present Illness Pt is a 76 y/o female s/p L3-5 PLIF. PMH includes anxiety, depression, and HTN.     PT Comments    Patient is progressing towards goals. Patient had no complaints of nausea and dizziness upon standing and was therefore able to ambulate. Patient able to ambulate with min assist to maneuver walker during lateral movements in tight spaces. Patient demonstrates difficulty recalling back precautions despite reinforcement throughout session. Pt will benefit from further reinforcement of back precautions throughout session.  Recommendation of SNF continues to be appropriate due to patients need for supervision and assistance with ADL's. PT will follow to maximize independence and mobility.  Follow Up Recommendations  SNF;Supervision/Assistance - 24 hour     Equipment Recommendations  Other (comment)    Recommendations for Other Services       Precautions / Restrictions Precautions Precautions: Back Precaution Booklet Issued: Yes (comment) Precaution Comments: Reviewed back precautions. Required Braces or Orthoses: Spinal Brace Spinal Brace: Lumbar corset;Applied in sitting position Restrictions Weight Bearing Restrictions: No    Mobility  Bed Mobility Overal bed mobility: Needs Assistance             General bed mobility comments: Arrived to pt sitting in chair eating breakfast.  Transfers Overall transfer level: Needs assistance Equipment used: Rolling walker (2 wheeled) Transfers: Sit to/from Stand Sit to Stand: Min assist         General transfer comment: Patient was slow and guarded during boost up to standing. VC's provided to use UE to boost to standing.  Ambulation/Gait Ambulation/Gait assistance: Min assist (Management of walker during sideways gait to meneuver tight ) Ambulation Distance (Feet): 100  Feet Assistive device: Rolling walker (2 wheeled) Gait Pattern/deviations: Step-through pattern;Decreased step length - right;Decreased step length - left;Decreased stride length;Trunk flexed Gait velocity: decreased Gait velocity interpretation: Below normal speed for age/gender General Gait Details: Patient ambulated with low guard and decreased gait speed. Patient received VC's for posture and proximity of RW. Patient educated on general walking program.   Stairs            Wheelchair Mobility    Modified Rankin (Stroke Patients Only)       Balance Overall balance assessment: Needs assistance Sitting-balance support: No upper extremity supported;Feet supported Sitting balance-Leahy Scale: Good Sitting balance - Comments: Able to scoot anterior and posterior to adjust position in chair.   Standing balance support: Bilateral upper extremity supported Standing balance-Leahy Scale: Fair                              Cognition Arousal/Alertness: Awake/alert Behavior During Therapy: WFL for tasks assessed/performed Overall Cognitive Status: Within Functional Limits for tasks assessed                                        Exercises      General Comments General comments (skin integrity, edema, etc.): Pt educated on generalized walking program. Nurse discontinued IV during session to improve mobility.      Pertinent Vitals/Pain Pain Assessment: Faces Faces Pain Scale: Hurts little more (When managing walker and walking sideways.) Pain Location: back  Pain Descriptors / Indicators: Aching;Operative site guarding;Discomfort Pain Intervention(s): Limited activity within patient's tolerance;Monitored during session  Home Living                      Prior Function            PT Goals (current goals can now be found in the care plan section) Acute Rehab PT Goals Patient Stated Goal: to go to SNF at d/c to get more independence.   PT Goal Formulation: With patient Time For Goal Achievement: 04/30/17 Potential to Achieve Goals: Good Progress towards PT goals: Progressing toward goals    Frequency    Min 5X/week      PT Plan Current plan remains appropriate    Co-evaluation              AM-PAC PT "6 Clicks" Daily Activity  Outcome Measure  Difficulty turning over in bed (including adjusting bedclothes, sheets and blankets)?: A Little Difficulty moving from lying on back to sitting on the side of the bed? : Unable Difficulty sitting down on and standing up from a chair with arms (e.g., wheelchair, bedside commode, etc,.)?: A Lot Help needed moving to and from a bed to chair (including a wheelchair)?: A Little Help needed walking in hospital room?: A Little Help needed climbing 3-5 steps with a railing? : Total 6 Click Score: 13    End of Session Equipment Utilized During Treatment: Gait belt;Back brace Activity Tolerance: Patient tolerated treatment well Patient left: in chair;with call bell/phone within reach Nurse Communication: Mobility status PT Visit Diagnosis: Unsteadiness on feet (R26.81);Muscle weakness (generalized) (M62.81);Pain     Time: 7353-2992 PT Time Calculation (min) (ACUTE ONLY): 25 min  Charges:                       G Codes:       Brain Honeycutt SPT   Numan Zylstra 04/24/2017, 10:10 AM

## 2017-04-24 NOTE — NC FL2 (Signed)
[place] LEVEL OF CARE SCREENING TOOL     IDENTIFICATION  Patient Name: Ana Alvarado Birthdate: July 21, 1941 Sex: female Admission Date (Current Location): 04/23/2017  [facility] and [place] Number:  Herbalist and Address:  [facility], [place] [address], [place], [place] [address]      Provider Number: [id number]  Attending Physician Name and Address:  Newman Pies, MD  Relative Name and Phone Number:  Jacqulynn Cadet, friend, [phone]    Current Level of Care: Hospital Recommended Level of Care: [place] Prior Approval Number:    Date Approved/Denied:   PASRR Number: [id number]  Discharge Plan: SNF    Current Diagnoses: Patient Active Problem List   Diagnosis Date Noted  . Degenerative scoliosis in adult patient 04/23/2017    Orientation RESPIRATION BLADDER Height & Weight     Self, Time, Situation, Place  Normal Continent Weight: 133 lb (60.3 kg) Height:     BEHAVIORAL SYMPTOMS/MOOD NEUROLOGICAL BOWEL NUTRITION STATUS      Continent Diet (please see DC summary)  AMBULATORY STATUS COMMUNICATION OF NEEDS Skin   Limited Assist Verbally Surgical wounds (closed incision on back, gauze)                       Personal Care Assistance Level of Assistance  Bathing, Feeding, Dressing Bathing Assistance: Limited assistance Feeding assistance: Independent Dressing Assistance: Limited assistance     Functional Limitations Info  Sight, Hearing, Speech Sight Info: Adequate (eyeglasses) Hearing Info: Adequate Speech Info: Adequate    SPECIAL CARE FACTORS FREQUENCY  PT (By licensed PT)     PT Frequency: 5x/week              Contractures Contractures Info: Not present    Additional Factors Info  Code Status, Allergies Code Status Info: Full Allergies Info: Aleve Naproxen Sodium, Codeine, Hydrocodone, Oxycodone           Current Medications (04/24/2017):  This is the current hospital  active medication list Current Facility-Administered Medications  Medication Dose Route Frequency Provider Last Rate Last Dose  . acetaminophen (TYLENOL) tablet 650 mg  650 mg Oral Q4H PRN Newman Pies, MD   650 mg at 04/24/17 0174   Or  . acetaminophen (TYLENOL) suppository 650 mg  650 mg Rectal Q4H PRN Newman Pies, MD      . bisacodyl (DULCOLAX) suppository 10 mg  10 mg Rectal Daily PRN Newman Pies, MD      . cyclobenzaprine (FLEXERIL) tablet 10 mg  10 mg Oral TID PRN Newman Pies, MD   10 mg at 04/24/17 0956  . docusate sodium (COLACE) capsule 100 mg  100 mg Oral BID Newman Pies, MD   100 mg at 04/24/17 0956  . gabapentin (NEURONTIN) capsule 300 mg  300 mg Oral TID Newman Pies, MD   300 mg at 04/24/17 0955  . HYDROmorphone (DILAUDID) tablet 2 mg  2 mg Oral Q3H PRN Newman Pies, MD   2 mg at 04/24/17 0956  . Influenza vac split quadrivalent PF (FLUZONE HIGH-DOSE) injection 0.5 mL  0.5 mL Intramuscular Tomorrow-1000 Newman Pies, MD      . lactated ringers bolus 1,000 mL  1,000 mL Intravenous Once Newman Pies, MD   1,000 mL at 04/24/17 0935  . menthol-cetylpyridinium (CEPACOL) lozenge 3 mg  1 lozenge Oral PRN Newman Pies, MD       Or  . phenol (CHLORASEPTIC) mouth spray 1 spray  1 spray Mouth/Throat  PRN Newman Pies, MD      . metoprolol tartrate (LOPRESSOR) tablet 25 mg  25 mg Oral BID Newman Pies, MD   25 mg at 04/23/17 2028  . morphine 4 MG/ML injection 4 mg  4 mg Intravenous Q2H PRN Newman Pies, MD   4 mg at 04/23/17 2341  . ondansetron (ZOFRAN) tablet 4 mg  4 mg Oral Q6H PRN Newman Pies, MD       Or  . ondansetron [facility]) injection 4 mg  4 mg Intravenous Q6H PRN Newman Pies, MD      . pantoprazole (PROTONIX) EC tablet 40 mg  40 mg Oral Daily Newman Pies, MD   40 mg at 04/24/17 0955  . zolpidem (AMBIEN) tablet 5 mg  5 mg Oral QHS PRN Newman Pies, MD         Discharge Medications: Please see discharge  summary for a list of discharge medications.  Relevant Imaging Results:  Relevant Lab Results:   Additional Information SSN: [id number]  Estanislado Emms, LCSW

## 2017-04-24 NOTE — Progress Notes (Signed)
Patient's BP noted to be low. MD made aware during roundings this morning.

## 2017-04-24 NOTE — Progress Notes (Signed)
Patient c/o bladder discomfort and has not voided since foley cath removal at 0600. Attempted twice with no success. Bladder scanned and noted 457ml. In and out cath with 733ml output. Will continue to monitor.

## 2017-04-24 NOTE — Progress Notes (Signed)
RN asked me to assess patient for intermittent confusion.  Patient transferred from [facility] around 1900, per nursing report that was given, patient was AxO =4, Primary RN assessed patient on arrival and patient was alert and oriented to self, place, and situation but not time.  Patient was sleeping when I walked, easily aroused, once aroused remained alert, intermittent confusion at times during conversation but can easily reoriented.  Current VSS are stable except 100.0 temp, + low grade fevers, has had low BP according to previously filled VS. RN stated that patient has had increased urinary urgency/frequency and patient does not seem like she is completely voids. Skin warm and dry, + pulses, 3L oxygen nasal cannula, sats > 93%.   Plan:- - Instructed RN to page [facility]U MD on call, update them on patient's status, perhaps see if UA or UC is needed.  - CXR from earlier +atelectasis, will have RN encourage IS as well  Patient is not in acute distress, + Pain in lower back, RN will medicate patient.   Start Time 2145 End Time 2215

## 2017-04-24 NOTE — Progress Notes (Signed)
Patient ID: Ana Alvarado, female   DOB: [birth year], 76 y.o.   MRN: [id number] Subjective:  The patient is alert and pleasant. She is in no apparent distress. Her back is appropriately sore.  Objective: Vital signs in last 24 hours: Temp:  [97.2 F (36.2 C)-101.5 F (38.6 C)] 99.7 F (37.6 C) (09/21 0640) Pulse Rate:  [66-87] 75 (09/21 0640) Resp:  [8-23] 16 (09/21 0520) BP: (96-132)/(48-90) 113/54 (09/21 0640) SpO2:  [89 %-100 %] 96 % (09/21 0520)  Intake/Output from previous day: 09/20 0701 - 09/21 0700 In: 1750 [I.V.:1500; IV Piggyback:100] Out: 3435 [Urine:3135; Blood:300] Intake/Output this shift: No intake/output data recorded.  Physical exam the patient is alert and pleasant. Her dressing is clean and dry. She is moving her lower extremities well.  Lab Results:  Recent Labs  04/24/17 0339  WBC 11.6*  HGB 10.7*  HCT 32.1*  PLT 192   BMET  Recent Labs  04/24/17 0339  NA 136  K 3.5  CL 103  CO2 25  GLUCOSE 123*  BUN 10  CREATININE 0.95  CALCIUM 8.3*    Studies/Results: Dg Lumbar Spine 2-3 Views  Result Date: 04/23/2017 CLINICAL DATA:  Lower lumbar fusion. EXAM: LUMBAR SPINE - 2-3 VIEW; DG C-ARM 61-120 MIN COMPARISON:  MRI lumbar spine dated March 20, 2017. FINDINGS: AP and lateral intraoperative x-rays demonstrate L3-L5 posterior and interbody fusion hardware. Trace stepwise anterolisthesis of L3 on L4 and L4 on L5. IMPRESSION: L3-L5 posterior and interbody fusion. FLUOROSCOPY TIME:  29 seconds. C-arm fluoroscopic images were obtained intraoperatively and submitted for post operative interpretation. Electronically Signed   By: Titus Dubin M.D.   On: 04/23/2017 12:59   Dg Lumbar Spine 1 View  Result Date: 04/23/2017 CLINICAL DATA:  Lumbar spine surgery. EXAM: LUMBAR SPINE - 1 VIEW COMPARISON:  Prior MRI 03/20/2017 . FINDINGS: Lumbar spine numbered as per prior MRI. Image labeled #1 demonstrates metallic marker posteriorly at L3-L4 . Anterolisthesis  L3-L4. Diffuse degenerative change. IMPRESSION: Metallic marker noted posteriorly at the L3-L4. Electronically Signed   By: Marcello Moores  Register   On: 04/23/2017 12:57   Dg C-arm 61-120 Min  Result Date: 04/23/2017 CLINICAL DATA:  Lower lumbar fusion. EXAM: LUMBAR SPINE - 2-3 VIEW; DG C-ARM 61-120 MIN COMPARISON:  MRI lumbar spine dated March 20, 2017. FINDINGS: AP and lateral intraoperative x-rays demonstrate L3-L5 posterior and interbody fusion hardware. Trace stepwise anterolisthesis of L3 on L4 and L4 on L5. IMPRESSION: L3-L5 posterior and interbody fusion. FLUOROSCOPY TIME:  29 seconds. C-arm fluoroscopic images were obtained intraoperatively and submitted for post operative interpretation. Electronically Signed   By: Titus Dubin M.D.   On: 04/23/2017 12:59    Assessment/Plan: Postop day #1: We will continue to mobilize the patient with PT. She is interested in [facility] skilled nursing facility for convalescence. THE case managers work on that.  Low-grade fever: This is likely secondary to recent surgery, atelectasis, etc. We will culture as needed.  LOS: 1 day     Haskel Dewalt D 04/24/2017, 7:28 AM

## 2017-04-25 DIAGNOSIS — M792 Neuralgia and neuritis, unspecified: Secondary | ICD-10-CM | POA: Diagnosis not present

## 2017-04-25 DIAGNOSIS — G3189 Other specified degenerative diseases of nervous system: Secondary | ICD-10-CM | POA: Diagnosis not present

## 2017-04-25 DIAGNOSIS — Z471 Aftercare following joint replacement surgery: Secondary | ICD-10-CM | POA: Diagnosis not present

## 2017-04-25 DIAGNOSIS — M4326 Fusion of spine, lumbar region: Secondary | ICD-10-CM | POA: Diagnosis not present

## 2017-04-25 DIAGNOSIS — M4320 Fusion of spine, site unspecified: Secondary | ICD-10-CM | POA: Diagnosis not present

## 2017-04-25 DIAGNOSIS — Z4782 Encounter for orthopedic aftercare following scoliosis surgery: Secondary | ICD-10-CM | POA: Diagnosis not present

## 2017-04-25 DIAGNOSIS — E876 Hypokalemia: Secondary | ICD-10-CM | POA: Diagnosis not present

## 2017-04-25 DIAGNOSIS — I10 Essential (primary) hypertension: Secondary | ICD-10-CM | POA: Diagnosis not present

## 2017-04-25 DIAGNOSIS — M549 Dorsalgia, unspecified: Secondary | ICD-10-CM | POA: Diagnosis not present

## 2017-04-25 MED ORDER — CYCLOBENZAPRINE HCL 10 MG PO TABS: 10.0000 mg | ORAL_TABLET | Freq: Three times a day (TID) | ORAL | 0 refills | Status: AC | PRN

## 2017-04-25 MED ORDER — HYDROCODONE-ACETAMINOPHEN 5-325 MG PO TABS: 1.0000 | ORAL_TABLET | ORAL | 0 refills | Status: AC | PRN

## 2017-04-25 MED ORDER — POTASSIUM CHLORIDE ER 10 MEQ PO TBCR: 10.0000 meq | EXTENDED_RELEASE_TABLET | Freq: Two times a day (BID) | ORAL | 0 refills | Status: AC

## 2017-04-25 NOTE — Progress Notes (Signed)
Reviewed notes from yesterday. No page received by either myself or Dr Kathyrn Sheriff regarding intermittent confusion. Discussed with RN this am. Apparently was given dilaudid prior to confusion episode. No other dilaudid and no confusion. This am alert and oriented x4. Feeling appropriately sore. MAEW with good strength. Incision c/d/i. Would like to leave to rehab today. I believe this is appropriate if bed is available. Orders signed if bed is available. This was communicated with nursing.

## 2017-04-25 NOTE — Discharge Summary (Signed)
Physician Discharge Summary  Patient ID: Ana Alvarado MRN: [id number] DOB/AGE: 74-Oct-1942 74 y.o.  Admit date: 04/23/2017 Discharge date: 04/25/2017  Admission Diagnoses:  Scoliosis  Discharge Diagnoses:  Same Active Problems:   Degenerative scoliosis in adult patient  Discharged Condition: Stable  Hospital Course:  Ana Alvarado is a 76 y.o. female who was admitted for the below procedure. There were no post operative complications. At time of discharge, pain was well controlled, ambulating with Pt/OT, tolerating po, voiding normal. Ready for discharge. PT/OT rec SNF. Agree with this. Potassium low at 3.0. Will supplement po x5 days. Will need repeat lab in 1 week with PCP.  Treatments: Surgery - Bilateral L3-4 and L4-5 Laminotomy/foraminotomies to decompress the bilateral L3, L4 and L5 nerve roots(the work required to do this was in addition to the work required to do the posterior lumbar interbody fusion because of the patient's spinal stenosis, facet arthropathy. Etc. requiring a wide decompression of the nerve roots.); L3-4 and L4-5 transforaminal lumbar interbody fusion with local morselized autograft bone and Kinnex graft extender; insertion of interbody prosthesis at L3-4 and L4-5 (globus peek expandable interbody prosthesis); posterior segmental instrumentation from L3 to L5 with globus titanium pedicle screws and rods; posterior lateral arthrodesis at L3-4 and L4-5 with local morselized autograft bone and Kinnex bone graft extender.  Discharge Exam: Blood pressure (!) 135/58, pulse 81, temperature 99 F (37.2 C), temperature source Oral, resp. rate 16, height 5\' 1"  (1.549 m), weight 60.3 kg (133 lb), SpO2 92 %. Awake, alert, oriented Speech fluent, appropriate CN grossly intact 5/5 BUE/BLE Wound c/d/i  Disposition: 01-Home or Self Care  Discharge Instructions    Call MD for:  difficulty breathing, headache or visual disturbances    Complete by:  As directed    Call MD for:  persistant dizziness or light-headedness    Complete by:  As directed    Call MD for:  redness, tenderness, or signs of infection (pain, swelling, redness, odor or green/yellow discharge around incision site)    Complete by:  As directed    Call MD for:  severe uncontrolled pain    Complete by:  As directed    Call MD for:  temperature >100.4    Complete by:  As directed    Diet general    Complete by:  As directed    Driving Restrictions    Complete by:  As directed    Do not drive until given clearance.   Increase activity slowly    Complete by:  As directed    Lifting restrictions    Complete by:  As directed    Do not lift anything >10lbs. Avoid bending and twisting in awkward positions. Avoid bending at the back.   May shower / Bathe    Complete by:  As directed    In 24 hours. Okay to wash wound with warm soapy water. Avoid scrubbing the wound. Pat dry.   Remove dressing in 24 hours    Complete by:  As directed      Allergies as of 04/25/2017      Reactions   Aleve [naproxen Sodium] Hives   Codeine Nausea And Vomiting   Hydrocodone Nausea And Vomiting   Oxycodone Nausea And Vomiting      Medication List    STOP taking these medications   potassium chloride SA 20 MEQ tablet Commonly known as:  K-DUR,KLOR-CON     TAKE these medications   cyclobenzaprine 10 MG tablet Commonly known as:  FLEXERIL Take 1 tablet (10 mg total) by mouth 3 (three) times daily as needed for muscle spasms.   diazepam 2 MG tablet Commonly known as:  VALIUM Take 2 mg by mouth at bedtime.   gabapentin 300 MG capsule Commonly known as:  NEURONTIN Take 300 mg by mouth 3 (three) times daily.   HYDROcodone-acetaminophen 5-325 MG tablet Commonly known as:  NORCO/VICODIN Take 1 tablet by mouth every 4 (four) hours as needed for moderate pain.   ibuprofen 800 MG tablet Commonly known as:  ADVIL,MOTRIN Take 800 mg by mouth daily as needed for headache (pain).   metoCLOPramide  10 MG tablet Commonly known as:  REGLAN Take 1 tablet (10 mg total) by mouth every 6 (six) hours.   metoprolol tartrate 25 MG tablet Commonly known as:  LOPRESSOR Take 25 mg by mouth 2 (two) times daily.   pantoprazole 40 MG tablet Commonly known as:  PROTONIX Take 40 mg by mouth daily.   potassium chloride 10 MEQ tablet Commonly known as:  K-DUR Take 1 tablet (10 mEq total) by mouth 2 (two) times daily.   ranitidine 150 MG tablet Commonly known as:  ZANTAC Take 150 mg by mouth daily as needed for heartburn (indigestion).   SLEEP-AID MAXIMUM STRENGTH 50 MG Caps Generic drug:  DiphenhydrAMINE HCl (Sleep) Take 50 mg by mouth at bedtime.            Discharge Care Instructions        Start     Ordered   04/25/17 0000  cyclobenzaprine (FLEXERIL) 10 MG tablet  3 times daily PRN     04/25/17 0946   04/25/17 0000  HYDROcodone-acetaminophen (NORCO/VICODIN) 5-325 MG tablet  Every 4 hours PRN     04/25/17 0946   04/25/17 0000  potassium chloride (K-DUR) 10 MEQ tablet  2 times daily     04/25/17 0946   04/25/17 0000  Increase activity slowly     04/25/17 0946   04/25/17 0000  May shower / Bathe    Comments:  In 24 hours. Okay to wash wound with warm soapy water. Avoid scrubbing the wound. Pat dry.   04/25/17 0946   04/25/17 0000  Driving Restrictions    Comments:  Do not drive until given clearance.   04/25/17 0946   04/25/17 0000  Lifting restrictions    Comments:  Do not lift anything >10lbs. Avoid bending and twisting in awkward positions. Avoid bending at the back.   04/25/17 0946   04/25/17 0000  Diet general     04/25/17 0946   04/25/17 0000  Remove dressing in 24 hours     04/25/17 0946   04/25/17 0000  Call MD for:  temperature >100.4     04/25/17 0946   04/25/17 0000  Call MD for:  severe uncontrolled pain     04/25/17 0946   04/25/17 0000  Call MD for:  redness, tenderness, or signs of infection (pain, swelling, redness, odor or green/yellow discharge around  incision site)     04/25/17 0946   04/25/17 0000  Call MD for:  difficulty breathing, headache or visual disturbances     04/25/17 0946   04/25/17 0000  Call MD for:  persistant dizziness or light-headedness     04/25/17 0946      Contact information for follow-up providers    Newman Pies, MD Follow up.   Specialty:  Neurosurgery Contact information: [address] [address] [place] [address] [phone]  Contact information for after-discharge care    Destination    [facility] Follow up.   Specialty:  [place] information: [place] [phone]                  Signed: Traci Sermon 04/25/2017, 9:46 AM

## 2017-04-25 NOTE — Progress Notes (Signed)
Patient has accepted bed offer at [facility]. Facility has been informed of patient and family's selection. Per facility representative, patient can arrive to facility at anytime.   Patient to be transported via [facility] to [facility] at anytime. D/C Summary sent to facility.  Number to call reports is [phone]. No further needs were requested at this time. CSW to sign off.   Please re-consult if further CSW needs arise.   Lucius Conn, [facility] Ph: [phone]

## 2017-04-30 DIAGNOSIS — Z471 Aftercare following joint replacement surgery: Secondary | ICD-10-CM | POA: Diagnosis not present

## 2017-05-11 DIAGNOSIS — G3189 Other specified degenerative diseases of nervous system: Secondary | ICD-10-CM | POA: Diagnosis not present

## 2017-05-11 DIAGNOSIS — I10 Essential (primary) hypertension: Secondary | ICD-10-CM | POA: Diagnosis not present

## 2017-05-11 DIAGNOSIS — M4316 Spondylolisthesis, lumbar region: Secondary | ICD-10-CM | POA: Diagnosis not present

## 2017-05-11 DIAGNOSIS — M792 Neuralgia and neuritis, unspecified: Secondary | ICD-10-CM | POA: Diagnosis not present

## 2017-05-11 DIAGNOSIS — M4326 Fusion of spine, lumbar region: Secondary | ICD-10-CM | POA: Diagnosis not present

## 2017-05-11 DIAGNOSIS — K219 Gastro-esophageal reflux disease without esophagitis: Secondary | ICD-10-CM | POA: Diagnosis not present

## 2017-05-11 DIAGNOSIS — Z4782 Encounter for orthopedic aftercare following scoliosis surgery: Secondary | ICD-10-CM | POA: Diagnosis not present

## 2017-05-11 DIAGNOSIS — E876 Hypokalemia: Secondary | ICD-10-CM | POA: Diagnosis not present

## 2017-05-13 DIAGNOSIS — G3189 Other specified degenerative diseases of nervous system: Secondary | ICD-10-CM | POA: Diagnosis not present

## 2017-05-13 DIAGNOSIS — E876 Hypokalemia: Secondary | ICD-10-CM | POA: Diagnosis not present

## 2017-05-13 DIAGNOSIS — Z4782 Encounter for orthopedic aftercare following scoliosis surgery: Secondary | ICD-10-CM | POA: Diagnosis not present

## 2017-05-13 DIAGNOSIS — M792 Neuralgia and neuritis, unspecified: Secondary | ICD-10-CM | POA: Diagnosis not present

## 2017-05-13 DIAGNOSIS — M4326 Fusion of spine, lumbar region: Secondary | ICD-10-CM | POA: Diagnosis not present

## 2017-05-13 DIAGNOSIS — M4316 Spondylolisthesis, lumbar region: Secondary | ICD-10-CM | POA: Diagnosis not present

## 2017-05-13 DIAGNOSIS — K219 Gastro-esophageal reflux disease without esophagitis: Secondary | ICD-10-CM | POA: Diagnosis not present

## 2017-05-13 DIAGNOSIS — I10 Essential (primary) hypertension: Secondary | ICD-10-CM | POA: Diagnosis not present

## 2017-05-20 DIAGNOSIS — M792 Neuralgia and neuritis, unspecified: Secondary | ICD-10-CM | POA: Diagnosis not present

## 2017-05-20 DIAGNOSIS — M4326 Fusion of spine, lumbar region: Secondary | ICD-10-CM | POA: Diagnosis not present

## 2017-05-20 DIAGNOSIS — K219 Gastro-esophageal reflux disease without esophagitis: Secondary | ICD-10-CM | POA: Diagnosis not present

## 2017-05-20 DIAGNOSIS — E876 Hypokalemia: Secondary | ICD-10-CM | POA: Diagnosis not present

## 2017-05-20 DIAGNOSIS — G3189 Other specified degenerative diseases of nervous system: Secondary | ICD-10-CM | POA: Diagnosis not present

## 2017-05-20 DIAGNOSIS — I10 Essential (primary) hypertension: Secondary | ICD-10-CM | POA: Diagnosis not present

## 2017-05-20 DIAGNOSIS — Z4782 Encounter for orthopedic aftercare following scoliosis surgery: Secondary | ICD-10-CM | POA: Diagnosis not present

## 2017-05-20 DIAGNOSIS — M4316 Spondylolisthesis, lumbar region: Secondary | ICD-10-CM | POA: Diagnosis not present

## 2017-05-25 DIAGNOSIS — M4326 Fusion of spine, lumbar region: Secondary | ICD-10-CM | POA: Diagnosis not present

## 2017-05-25 DIAGNOSIS — M792 Neuralgia and neuritis, unspecified: Secondary | ICD-10-CM | POA: Diagnosis not present

## 2017-05-25 DIAGNOSIS — Z23 Encounter for immunization: Secondary | ICD-10-CM | POA: Diagnosis not present

## 2017-05-25 DIAGNOSIS — G3189 Other specified degenerative diseases of nervous system: Secondary | ICD-10-CM | POA: Diagnosis not present

## 2017-05-25 DIAGNOSIS — K219 Gastro-esophageal reflux disease without esophagitis: Secondary | ICD-10-CM | POA: Diagnosis not present

## 2017-05-25 DIAGNOSIS — M4316 Spondylolisthesis, lumbar region: Secondary | ICD-10-CM | POA: Diagnosis not present

## 2017-05-25 DIAGNOSIS — Z4782 Encounter for orthopedic aftercare following scoliosis surgery: Secondary | ICD-10-CM | POA: Diagnosis not present

## 2017-05-25 DIAGNOSIS — E876 Hypokalemia: Secondary | ICD-10-CM | POA: Diagnosis not present

## 2017-05-25 DIAGNOSIS — I10 Essential (primary) hypertension: Secondary | ICD-10-CM | POA: Diagnosis not present

## 2017-05-25 DIAGNOSIS — G894 Chronic pain syndrome: Secondary | ICD-10-CM | POA: Diagnosis not present

## 2017-06-01 DIAGNOSIS — Z4782 Encounter for orthopedic aftercare following scoliosis surgery: Secondary | ICD-10-CM | POA: Diagnosis not present

## 2017-06-01 DIAGNOSIS — M792 Neuralgia and neuritis, unspecified: Secondary | ICD-10-CM | POA: Diagnosis not present

## 2017-06-01 DIAGNOSIS — I10 Essential (primary) hypertension: Secondary | ICD-10-CM | POA: Diagnosis not present

## 2017-06-01 DIAGNOSIS — M4316 Spondylolisthesis, lumbar region: Secondary | ICD-10-CM | POA: Diagnosis not present

## 2017-06-01 DIAGNOSIS — M4326 Fusion of spine, lumbar region: Secondary | ICD-10-CM | POA: Diagnosis not present

## 2017-06-01 DIAGNOSIS — K219 Gastro-esophageal reflux disease without esophagitis: Secondary | ICD-10-CM | POA: Diagnosis not present

## 2017-06-01 DIAGNOSIS — E876 Hypokalemia: Secondary | ICD-10-CM | POA: Diagnosis not present

## 2017-06-01 DIAGNOSIS — G3189 Other specified degenerative diseases of nervous system: Secondary | ICD-10-CM | POA: Diagnosis not present

## 2017-06-03 DIAGNOSIS — R5383 Other fatigue: Secondary | ICD-10-CM | POA: Diagnosis not present

## 2017-08-14 DIAGNOSIS — G4700 Insomnia, unspecified: Secondary | ICD-10-CM | POA: Diagnosis not present

## 2017-08-14 DIAGNOSIS — I10 Essential (primary) hypertension: Secondary | ICD-10-CM | POA: Diagnosis not present

## 2017-09-02 DIAGNOSIS — G43809 Other migraine, not intractable, without status migrainosus: Secondary | ICD-10-CM | POA: Diagnosis not present

## 2017-09-02 DIAGNOSIS — D23121 Other benign neoplasm of skin of left upper eyelid, including canthus: Secondary | ICD-10-CM | POA: Diagnosis not present

## 2017-09-02 DIAGNOSIS — H5319 Other subjective visual disturbances: Secondary | ICD-10-CM | POA: Diagnosis not present

## 2017-12-25 IMAGING — CR DG LUMBAR SPINE 1V
1 series · 1 of 1 positions shown · non-contrast
Comparison: Prior MRI 03/20/2017 .

CLINICAL DATA: Lumbar spine surgery.

EXAM:
LUMBAR SPINE - 1 VIEW

[lateral]
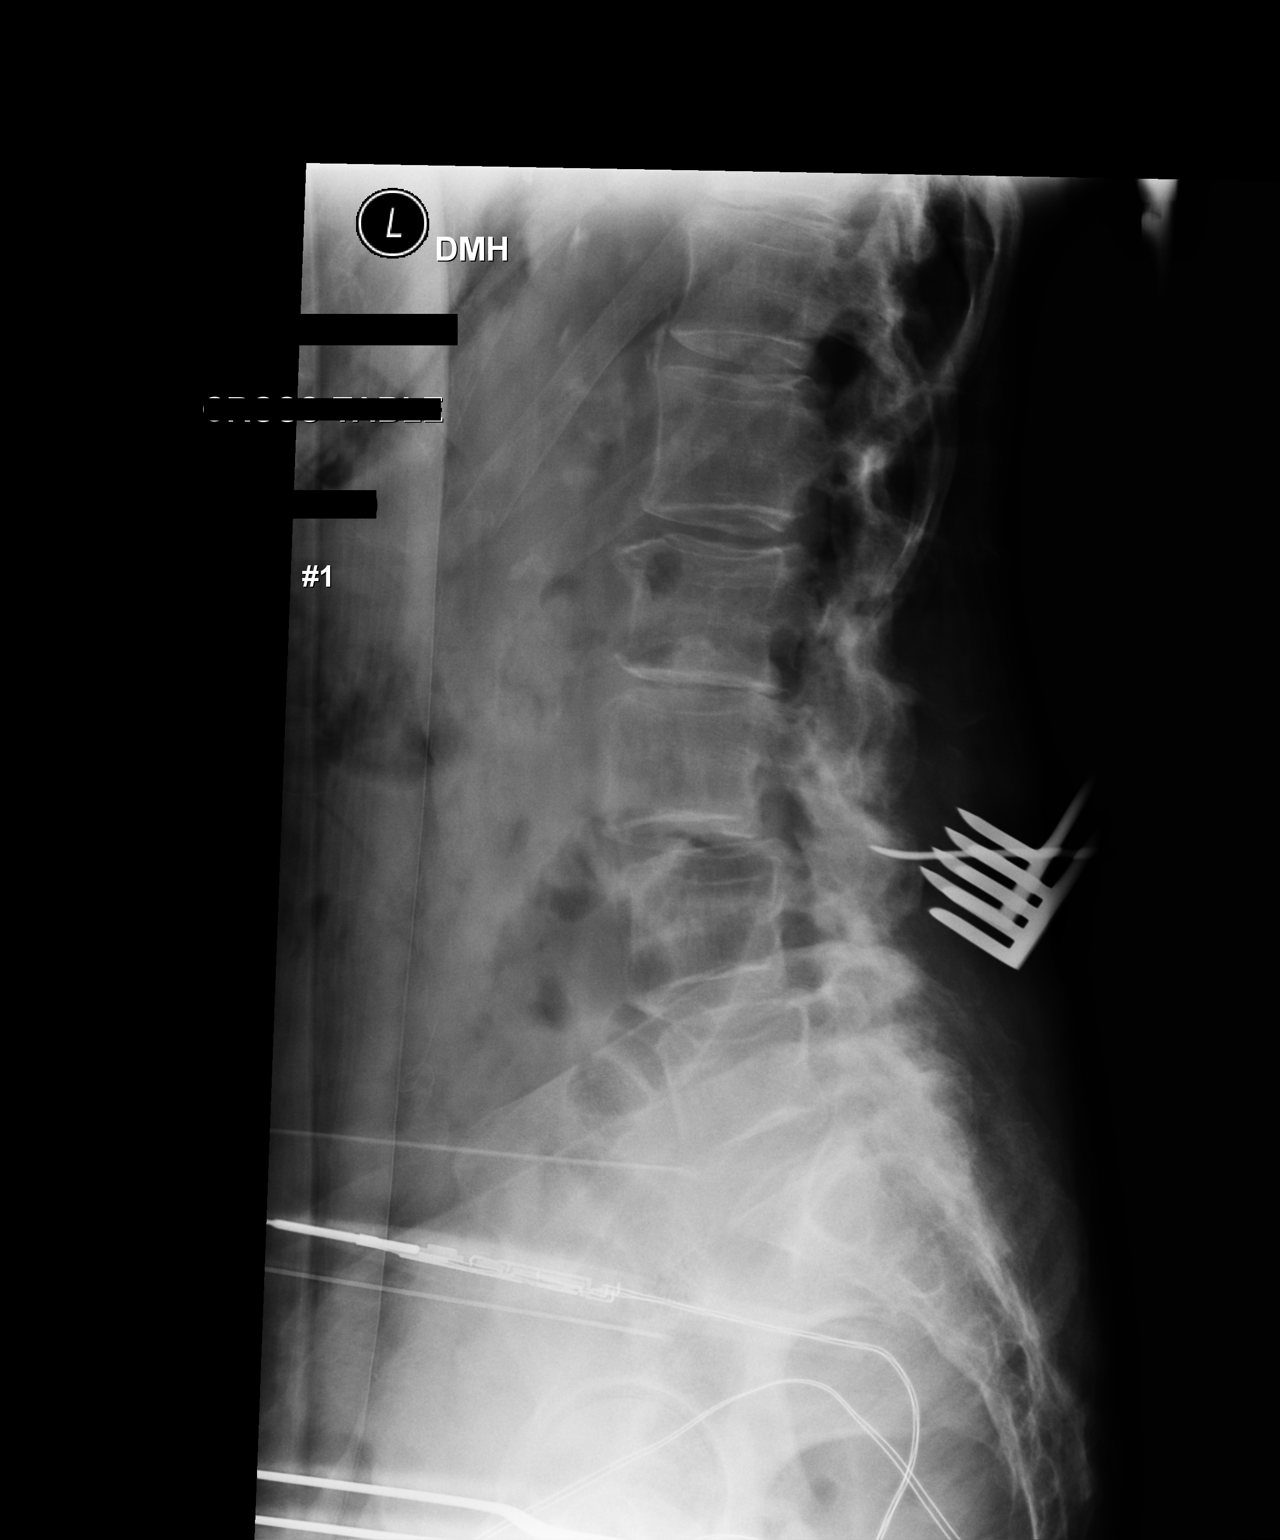

[1 of 1 positions shown; findings below may reference images not displayed]

FINDINGS: Lumbar spine numbered as per prior MRI. Image labeled #1
demonstrates metallic marker posteriorly at L3-L4 . Anterolisthesis
L3-L4. Diffuse degenerative change.
IMPRESSION: Metallic marker noted posteriorly at the L3-L4.

## 2017-12-26 IMAGING — CR DG CHEST 1V PORT
1 series · 1 of 1 positions shown · non-contrast
Comparison: 10/11/2012.

CLINICAL DATA: Postoperative fever following lumbar spine fusion
yesterday.

EXAM:
PORTABLE CHEST 1 VIEW

[AP]
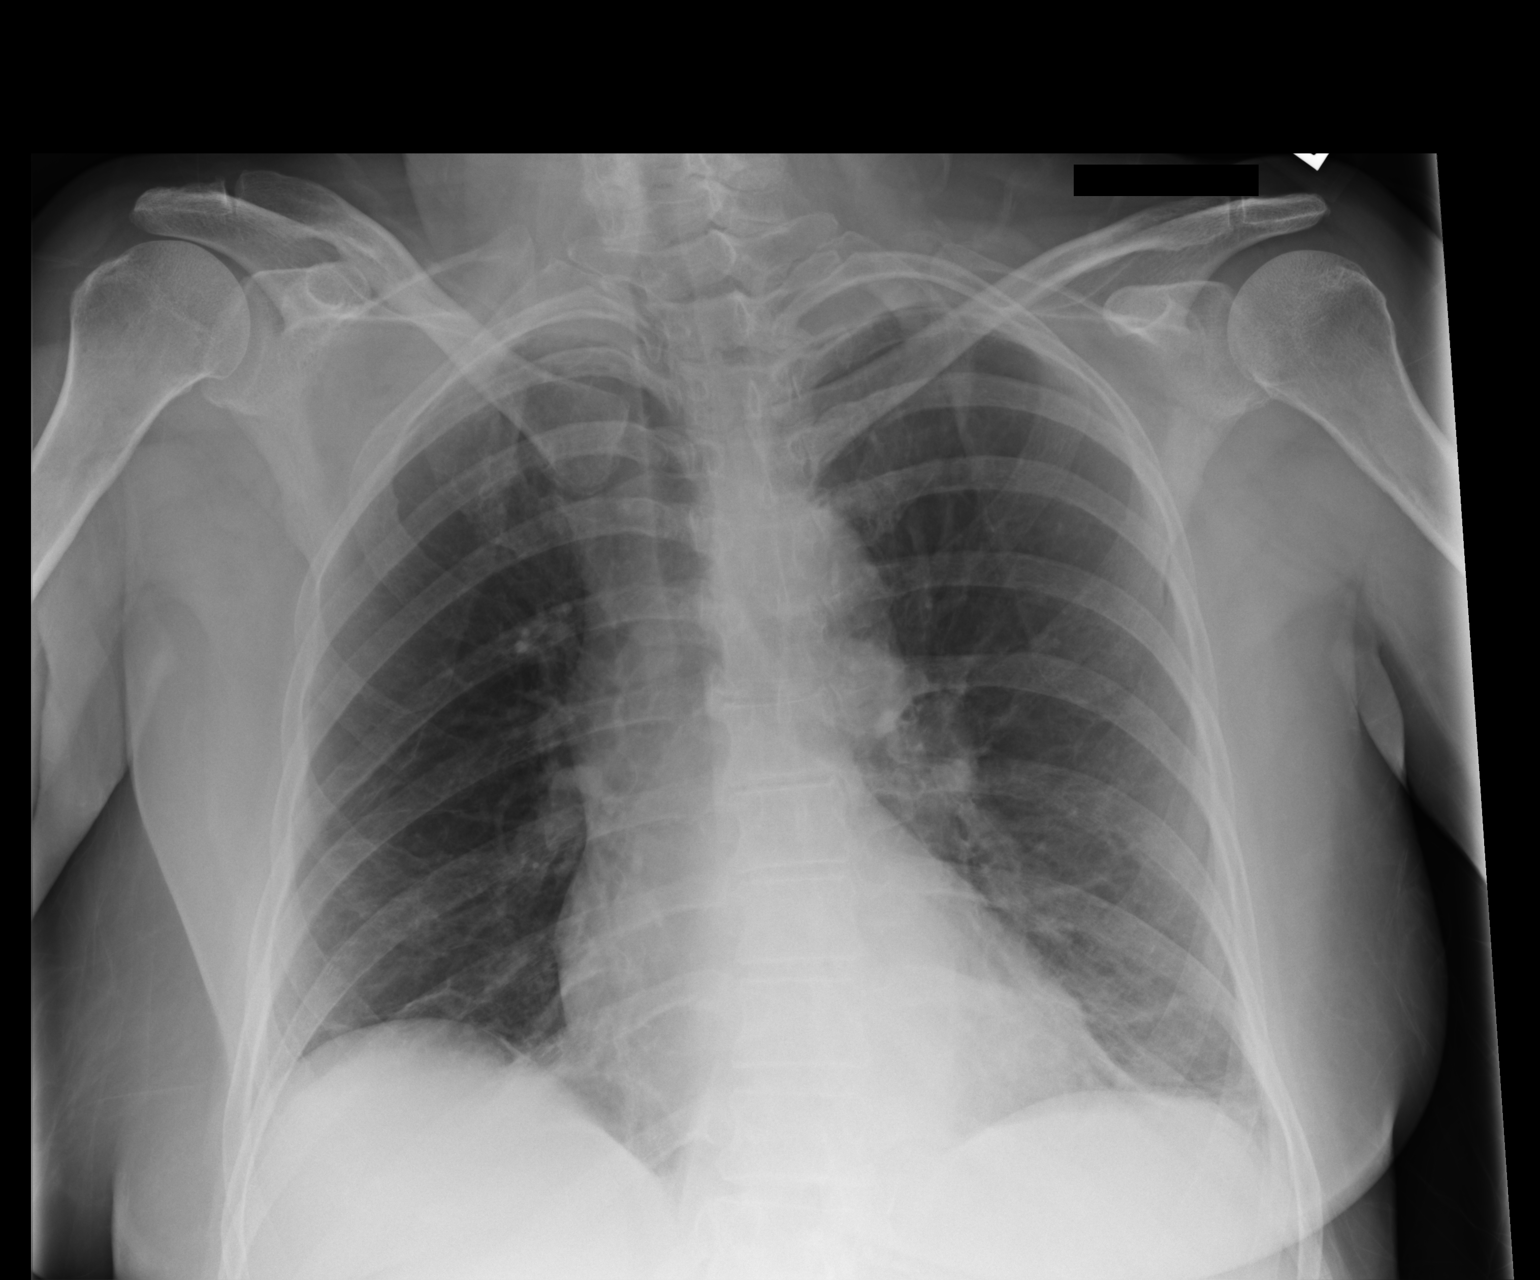

[1 of 1 positions shown; findings below may reference images not displayed]

FINDINGS: Normal sized heart. Interval mild linear atelectasis at both lung
bases. Thoracic spine degenerative changes.
IMPRESSION: Interval mild bibasilar linear atelectasis.

## 2018-11-03 DEATH — deceased
